# Patient Record
Sex: Female | Born: 1970 | Race: White | Hispanic: No | Marital: Married | Smoking: Never smoker
Health system: Southern US, Community
[De-identification: ages and names within clinical notes are randomized; demographics above are authoritative.]

## PROBLEM LIST (undated history)

## (undated) DIAGNOSIS — F419 Anxiety disorder, unspecified: Secondary | ICD-10-CM

## (undated) HISTORY — PX: TONSILLECTOMY: SUR1361

## (undated) HISTORY — PX: BUNIONECTOMY: SHX129

## (undated) HISTORY — PX: TUBAL LIGATION: SHX77

## (undated) HISTORY — PX: EYE SURGERY: SHX253

---

## 2014-06-14 ENCOUNTER — Emergency Department (HOSPITAL_BASED_OUTPATIENT_CLINIC_OR_DEPARTMENT_OTHER): Payer: BLUE CROSS/BLUE SHIELD

## 2014-06-14 ENCOUNTER — Observation Stay (HOSPITAL_BASED_OUTPATIENT_CLINIC_OR_DEPARTMENT_OTHER)
Admission: EM | Admit: 2014-06-14 | Discharge: 2014-06-17 | Disposition: A | Discharge status: Home / Self Care | Payer: BLUE CROSS/BLUE SHIELD | Attending: Internal Medicine | Admitting: Internal Medicine

## 2014-06-14 ENCOUNTER — Encounter (HOSPITAL_BASED_OUTPATIENT_CLINIC_OR_DEPARTMENT_OTHER): Payer: Self-pay | Admitting: *Deleted

## 2014-06-14 DIAGNOSIS — K8020 Calculus of gallbladder without cholecystitis without obstruction: Secondary | ICD-10-CM | POA: Diagnosis present

## 2014-06-14 DIAGNOSIS — E876 Hypokalemia: Secondary | ICD-10-CM | POA: Diagnosis not present

## 2014-06-14 DIAGNOSIS — R945 Abnormal results of liver function studies: Secondary | ICD-10-CM

## 2014-06-14 DIAGNOSIS — K8050 Calculus of bile duct without cholangitis or cholecystitis without obstruction: Secondary | ICD-10-CM | POA: Diagnosis present

## 2014-06-14 DIAGNOSIS — R7989 Other specified abnormal findings of blood chemistry: Secondary | ICD-10-CM | POA: Diagnosis present

## 2014-06-14 DIAGNOSIS — K8010 Calculus of gallbladder with chronic cholecystitis without obstruction: Principal | ICD-10-CM | POA: Diagnosis present

## 2014-06-14 DIAGNOSIS — F419 Anxiety disorder, unspecified: Secondary | ICD-10-CM | POA: Diagnosis not present

## 2014-06-14 DIAGNOSIS — Z419 Encounter for procedure for purposes other than remedying health state, unspecified: Secondary | ICD-10-CM

## 2014-06-14 DIAGNOSIS — F39 Unspecified mood [affective] disorder: Secondary | ICD-10-CM | POA: Diagnosis not present

## 2014-06-14 HISTORY — DX: Anxiety disorder, unspecified: F41.9

## 2014-06-14 LAB — COMPREHENSIVE METABOLIC PANEL
ALBUMIN: 3.9 g/dL (ref 3.5–5.2)
ALT: 447 U/L — ABNORMAL HIGH (ref 0–35)
ANION GAP: 10 (ref 5–15)
AST: 265 U/L — ABNORMAL HIGH (ref 0–37)
Alkaline Phosphatase: 67 U/L (ref 39–117)
BUN: 16 mg/dL (ref 6–23)
CHLORIDE: 102 mmol/L (ref 96–112)
CO2: 26 mmol/L (ref 19–32)
CREATININE: 0.99 mg/dL (ref 0.50–1.10)
Calcium: 9.2 mg/dL (ref 8.4–10.5)
GFR, EST AFRICAN AMERICAN: 79 mL/min — AB (ref >90)
GFR, EST NON AFRICAN AMERICAN: 68 mL/min — AB (ref >90)
GLUCOSE: 98 mg/dL (ref 70–99)
Potassium: 3.3 mmol/L — ABNORMAL LOW (ref 3.5–5.1)
Sodium: 138 mmol/L (ref 135–145)
Total Bilirubin: 2.4 mg/dL — ABNORMAL HIGH (ref 0.3–1.2)
Total Protein: 6.9 g/dL (ref 6.0–8.3)

## 2014-06-14 LAB — CBC WITH DIFFERENTIAL/PLATELET
BASOS PCT: 0 % (ref 0–1)
Basophils Absolute: 0 10*3/uL (ref 0.0–0.1)
Eosinophils Absolute: 0.1 10*3/uL (ref 0.0–0.7)
Eosinophils Relative: 1 % (ref 0–5)
HEMATOCRIT: 40.7 % (ref 36.0–46.0)
Hemoglobin: 13.7 g/dL (ref 12.0–15.0)
LYMPHS PCT: 21 % (ref 12–46)
Lymphs Abs: 1.7 10*3/uL (ref 0.7–4.0)
MCH: 30.6 pg (ref 26.0–34.0)
MCHC: 33.7 g/dL (ref 30.0–36.0)
MCV: 90.8 fL (ref 78.0–100.0)
Monocytes Absolute: 0.5 10*3/uL (ref 0.1–1.0)
Monocytes Relative: 7 % (ref 3–12)
NEUTROS ABS: 5.6 10*3/uL (ref 1.7–7.7)
Neutrophils Relative %: 71 % (ref 43–77)
PLATELETS: 238 10*3/uL (ref 150–400)
RBC: 4.48 MIL/uL (ref 3.87–5.11)
RDW: 12.8 % (ref 11.5–15.5)
WBC: 7.9 10*3/uL (ref 4.0–10.5)

## 2014-06-14 LAB — LIPASE, BLOOD: LIPASE: 35 U/L (ref 11–59)

## 2014-06-14 MED ORDER — OXYCODONE HCL 5 MG PO TABS: 5.0000 mg | ORAL_TABLET | ORAL | Status: DC | PRN

## 2014-06-14 MED ORDER — MORPHINE SULFATE 4 MG/ML IJ SOLN: 4.0000 mg | Freq: Once | INTRAMUSCULAR | Status: DC

## 2014-06-14 MED ORDER — HEPARIN SODIUM (PORCINE) 5000 UNIT/ML IJ SOLN
5000.0000 [IU] | Freq: Three times a day (TID) | INTRAMUSCULAR | Status: DC
  Administered 2014-06-14 – 2014-06-15 (×3): 5000 [IU] via SUBCUTANEOUS
  Filled 2014-06-14 (×8): qty 1

## 2014-06-14 MED ORDER — MORPHINE SULFATE 2 MG/ML IJ SOLN
2.0000 mg | INTRAMUSCULAR | Status: DC | PRN
  Administered 2014-06-15 (×3): 2 mg via INTRAVENOUS
  Filled 2014-06-14 (×3): qty 1

## 2014-06-14 MED ORDER — HYDROMORPHONE HCL 1 MG/ML IJ SOLN: 1.0000 mg | INTRAMUSCULAR | Status: DC | PRN

## 2014-06-14 MED ORDER — ONDANSETRON HCL 4 MG/2ML IJ SOLN: 4.0000 mg | Freq: Three times a day (TID) | INTRAMUSCULAR | Status: DC | PRN

## 2014-06-14 MED ORDER — ONDANSETRON HCL 4 MG PO TABS: 4.0000 mg | ORAL_TABLET | Freq: Four times a day (QID) | ORAL | Status: DC | PRN

## 2014-06-14 MED ORDER — ALPRAZOLAM 0.5 MG PO TABS
0.5000 mg | ORAL_TABLET | Freq: Every evening | ORAL | Status: DC | PRN
  Administered 2014-06-15 – 2014-06-17 (×3): 0.5 mg via ORAL
  Filled 2014-06-14 (×3): qty 1

## 2014-06-14 MED ORDER — CITALOPRAM HYDROBROMIDE 10 MG PO TABS
10.0000 mg | ORAL_TABLET | Freq: Every day | ORAL | Status: DC
  Administered 2014-06-15 – 2014-06-17 (×2): 10 mg via ORAL
  Filled 2014-06-14 (×3): qty 1

## 2014-06-14 MED ORDER — ONDANSETRON HCL 4 MG/2ML IJ SOLN: 4.0000 mg | Freq: Four times a day (QID) | INTRAMUSCULAR | Status: DC | PRN

## 2014-06-14 MED ORDER — MORPHINE SULFATE 2 MG/ML IJ SOLN
2.0000 mg | Freq: Once | INTRAMUSCULAR | Status: AC
  Administered 2014-06-14: 2 mg via INTRAVENOUS
  Filled 2014-06-14: qty 1

## 2014-06-14 MED ORDER — SODIUM CHLORIDE 0.9 % IV SOLN
INTRAVENOUS | Status: DC
  Administered 2014-06-14 – 2014-06-16 (×4): via INTRAVENOUS

## 2014-06-14 NOTE — ED Notes (Signed)
Earrings and all Jewelry given to family.

## 2014-06-14 NOTE — ED Notes (Signed)
Abdominal pain. She has been having episodes of epigastric pain with no regularity or pattern over the past 4 weeks. Unrelieved by antiacids or Ibuprofen.

## 2014-06-14 NOTE — ED Notes (Signed)
Stopped doxycyline 2 weeks ago for rash.

## 2014-06-14 NOTE — H&P (Addendum)
[facility] History and Physical  Patient: Darlene Stevens  MRN: [id number]  DOB: March 29, 1970  DOS: the patient was seen and examined on 06/14/2014 PCP: Almedia Balls, MD  Chief Complaint: Abdominal pain  HPI: Darlene Stevens is a 44 y.o. female with Past medical history of anxiety. The patient is presenting with complaints of abdominal pain that has been ongoing since last few days. The pain is located in the epigastric and has no association with food. The pain is sharp and crampy and resolved within half an hour on its own. She does not have any vomiting but has felt nausea with the pain. She has occasional acid reflux. Denies any diarrhea or constipation or change in the color of the stool. Denies any burning urination. Denies any fever or chills. Denies any rash anywhere. She has history of cesarean section as well as tubal ligation. She does not abuse alcohol. She does not use regular Tylenol. Denies any use of herbal supplements.  She does not have any family history of major liver disorder, heart disorders.  The patient is coming from home And at her baseline independent for most of her ADL.  Review of Systems: as mentioned in the history of present illness.  A comprehensive review of the other systems is negative.  Past Medical History  Diagnosis Date  . Anxiety    Past Surgical History  Procedure Laterality Date  . Cesarean section    . Tonsillectomy    . Eye surgery    . Tubal ligation    . Bunionectomy     Social History:  reports that she has never smoked. She does not have any smokeless tobacco history on file. She reports that she does not drink alcohol or use illicit drugs.  No Known Allergies  History reviewed. No pertinent family history.  Prior to Admission medications   Medication Sig Start Date End Date Taking? Authorizing Provider  ALPRAZolam Prudy Feeler) 0.5 MG tablet Take 0.5 mg by mouth at bedtime as needed for anxiety or sleep.   Yes Historical Provider, MD    citalopram (CELEXA) 10 MG tablet Take 10 mg by mouth every morning.   Yes Historical Provider, MD    Physical Exam: Filed Vitals:   06/14/14 1416 06/14/14 1730 06/14/14 1822 06/14/14 1917  BP: 142/84 121/70 117/66 118/60  Pulse: 60 58 56 50  Temp: 98.3 F (36.8 C)  98 F (36.7 C) 97.9 F (36.6 C)  TempSrc: Oral  Oral Oral  Resp: Height:  (1.727 m)    (1.727 m)  Weight: 62.143 kg (137 lb)   62.823 kg (138 lb 8 oz)  SpO2: 100% 100% 100% 98%    General: Alert, Awake and Oriented to Time, Place and Person. Appear in mild distress Eyes: PERRL ENT: Oral Mucosa clear moist. Neck: no JVD Cardiovascular: S1 and S2 Present, no Murmur, Peripheral Pulses Present Respiratory: Bilateral Air entry equal and Decreased,  Clear to Auscultation, njoCrackles, no wheezes Abdomen: Bowel Sound present, Soft and mildly epigastric tender Skin: no Rash Extremities: no Pedal edema, no calf tenderness Neurologic: Grossly no focal neuro deficit.  Labs on Admission:  CBC:  Recent Labs Lab 06/14/14 1440  WBC 7.9  NEUTROABS 5.6  HGB 13.7  HCT 40.7  MCV 90.8  PLT 238    CMP     Component Value Date/Time   NA 138 06/14/2014 1440   K 3.3* 06/14/2014 1440   CL 102 06/14/2014 1440   CO2 26 06/14/2014  1440   GLUCOSE 98 06/14/2014 1440   BUN 16 06/14/2014 1440   CREATININE 0.99 06/14/2014 1440   CALCIUM 9.2 06/14/2014 1440   PROT 6.9 06/14/2014 1440   ALBUMIN 3.9 06/14/2014 1440   AST 265* 06/14/2014 1440   ALT 447* 06/14/2014 1440   ALKPHOS 67 06/14/2014 1440   BILITOT 2.4* 06/14/2014 1440   GFRNONAA 68* 06/14/2014 1440   GFRAA 79* 06/14/2014 1440     Recent Labs Lab 06/14/14 1440  LIPASE 35    No results for input(s): CKTOTAL, CKMB, CKMBINDEX, TROPONINI in the last 168 hours. BNP (last 3 results) No results for input(s): BNP in the last 8760 hours.  ProBNP (last 3 results) No results for input(s): PROBNP in the last 8760 hours.   Radiological Exams  on Admission: [place] Abdomen Complete  06/14/2014   CLINICAL DATA:  Epigastric pain, nausea for 3 weeks. Worsening today.  EXAM: ULTRASOUND ABDOMEN COMPLETE  COMPARISON:  None.  FINDINGS: Gallbladder: Gallbladder appears distended. Multiple small mobile echogenic foci compatible with gallstones, the largest 1 cm. No wall thickening. Negative sonographic Murphy's.  Common bile duct: Diameter: Upper limits normal in caliber at 7 mm.  Liver: Suggestion of intrahepatic biliary ductal dilatation. No focal abnormality. Normal echotexture.  IVC: No abnormality visualized.  Pancreas: Visualized portion unremarkable.  Spleen: Size and appearance within normal limits.  Right Kidney: Length: 11.3 cm. Echogenicity within normal limits. No mass or hydronephrosis visualized.  Left Kidney: Length: 11.5 cm. Echogenicity within normal limits. No mass or hydronephrosis visualized.  Abdominal aorta: No aneurysm visualized.  Other findings: None.  IMPRESSION: Mild gallbladder distention with several small mobile gallstones. No wall thickening or sonographic Murphy's sign.  Borderline common bile duct diameter at 7 mm. Suggestion of mild intrahepatic biliary ductal dilatation. Recommend correlation with LFTs.   Electronically Signed   By: Charlett NoseKevin  Dover M.D.   On: 06/14/2014 16:43    Assessment/Plan Principal Problem:   Biliary colic Active Problems:   Gallstones   LFT elevation   Hypokalemia   Mood disorder   1. Biliary colic The patient is presenting with complaints of abdominal pain. She was found to have elevated LFT. The pain was mild. An ultrasound was showing presence of gallstones. Case was discussed with general surgery Dr. Ricky StabsJerkins who will be seeing the patient in the morning. Case was also discussed with Dr. Marina GoodellPerry who will be also seeing the patient in the morning. Recommended to give the patient nothing by mouth except medication due to ongoing pain. Lipase levels are negative suggesting no significant and  hepatitis. We will recheck lipase level again in the morning.  2. Hypokalemia. Replacing intravenously.  3. Mood disorder, anxiety. Continuing home medications.  4. Elevated LFT most likely secondary to gallstones. Avoiding hepatotoxic medications. Further workup depending on the recommendation.   Consults: Gen. surgery Dr. Ricky StabsJerkins, Dr. Marina GoodellPerry from gastroenterology  DVT Prophylaxis: subcutaneous Heparin Nutrition: Nothing by mouth except medication due to ongoing pain  Disposition: Admitted as inpatient, med-surge unit.  Author: Lynden OxfordPranav Adellyn Capek, MD [facility] Pager: [phone] 06/14/2014  If 7PM-7AM, please contact night-coverage [email or web address] Password [email or web address]

## 2014-06-14 NOTE — ED Provider Notes (Signed)
CSN: [id number]     Arrival date & time 06/14/14  1409 History   First MD Initiated Contact with Patient 06/14/14 1421     Chief Complaint  Patient presents with  . Abdominal Pain     (Consider location/radiation/quality/duration/timing/severity/associated sxs/prior Treatment) HPI Comments: 44 year old female complaining of intermittent epigastric abdominal pain 4 weeks. Pain localized to the epigastric area, nonradiating, described as dull. No aggravating or alleviating factors. No relation to food. Pain unrelieved by antacids or ibuprofen. Denies fever, chills, nausea, vomiting, urinary symptoms, chest pain or shortness of breath. Pain typically resolves in about 30 minutes, however today, the pain lasted a few hours, and is starting to subside on its own. No appetite change.  Patient is a 44 y.o. female presenting with abdominal pain. The history is provided by the patient.  Abdominal Pain   Past Medical History  Diagnosis Date  . Anxiety    Past Surgical History  Procedure Laterality Date  . Cesarean section    . Tonsillectomy    . Eye surgery    . Tubal ligation     No family history on file. History  Substance Use Topics  . Smoking status: Never Smoker   . Smokeless tobacco: Not on file  . Alcohol Use: No   OB History    No data available     Review of Systems  Gastrointestinal: Positive for abdominal pain.  All other systems reviewed and are negative.     Allergies  Review of patient's allergies indicates no known allergies.  Home Medications   Prior to Admission medications   Medication Sig Start Date End Date Taking? Authorizing Provider  citalopram (CELEXA) 10 MG tablet Take 10 mg by mouth daily.   Yes Historical Provider, MD   BP 121/70 mmHg  Pulse 58  Temp(Src) 98.3 F (36.8 C) (Oral)  Resp 17  Ht  (1.727 m)  Wt 137 lb (62.143 kg)  BMI 20.84 kg/m2  SpO2 100% Physical Exam  Constitutional: She is oriented to person, place, and time. She  appears well-developed and well-nourished. No distress.  HENT:  Head: Normocephalic and atraumatic.  Mouth/Throat: Oropharynx is clear and moist.  Eyes: Conjunctivae and EOM are normal.  Neck: Normal range of motion. Neck supple.  Cardiovascular: Normal rate, regular rhythm and normal heart sounds.   Pulmonary/Chest: Effort normal and breath sounds normal. No respiratory distress.  Abdominal: Soft. Normal appearance and bowel sounds are normal. She exhibits no distension. There is tenderness in the right upper quadrant and epigastric area. There is positive Murphy's sign. There is no rigidity, no rebound and no guarding.  Musculoskeletal: Normal range of motion. She exhibits no edema.  Neurological: She is alert and oriented to person, place, and time. No sensory deficit.  Skin: Skin is warm and dry.  Psychiatric: She has a normal mood and affect. Her behavior is normal.  Nursing note and vitals reviewed.   ED Course  Procedures (including critical care time) Labs Review Labs Reviewed  COMPREHENSIVE METABOLIC PANEL - Abnormal; Notable for the following:    Potassium 3.3 (*)    AST 265 (*)    ALT 447 (*)    Total Bilirubin 2.4 (*)    GFR calc non Af Amer 68 (*)    GFR calc Af Amer 79 (*)    All other components within normal limits  CBC WITH DIFFERENTIAL/PLATELET  LIPASE, BLOOD    Imaging Review US Abdomen Complete  06/14/2014   CLINICAL DATA:  Epigastric pain,  nausea for 3 weeks. Worsening today.  EXAM: ULTRASOUND ABDOMEN COMPLETE  COMPARISON:  None.  FINDINGS: Gallbladder: Gallbladder appears distended. Multiple small mobile echogenic foci compatible with gallstones, the largest 1 cm. No wall thickening. Negative sonographic Murphy's.  Common bile duct: Diameter: Upper limits normal in caliber at 7 mm.  Liver: Suggestion of intrahepatic biliary ductal dilatation. No focal abnormality. Normal echotexture.  IVC: No abnormality visualized.  Pancreas: Visualized portion unremarkable.   Spleen: Size and appearance within normal limits.  Right Kidney: Length: 11.3 cm. Echogenicity within normal limits. No mass or hydronephrosis visualized.  Left Kidney: Length: 11.5 cm. Echogenicity within normal limits. No mass or hydronephrosis visualized.  Abdominal aorta: No aneurysm visualized.  Other findings: None.  IMPRESSION: Mild gallbladder distention with several small mobile gallstones. No wall thickening or sonographic Murphy's sign.  Borderline common bile duct diameter at 7 mm. Suggestion of mild intrahepatic biliary ductal dilatation. Recommend correlation with LFTs.   Electronically Signed   By: Charlett NoseKevin  Dover M.D.   On: 06/14/2014 16:43     EKG Interpretation   Date/Time:  Tuesday June 14 2014 14:25:41 EDT Ventricular Rate:  54 PR Interval:  138 QRS Duration: 82 QT Interval:  440 QTC Calculation: 417 R Axis:   81 Text Interpretation:  Sinus bradycardia with Premature atrial complexes  Otherwise normal ECG No old tracing to compare Confirmed by GOLDSTON  MD,  SCOTT ([id number]) on 06/14/2014 2:30:18 PM      MDM   Final diagnoses:  Gallstones  Biliary colic   NAD. Nontoxic appearing. AF of VSS. Abdomen soft with epigastric and right upper quadrant tenderness and positive Murphy's sign. Initial concern for gallbladder pathology. Labs obtained showing elevated liver enzymes and bili. Abdominal ultrasound with results as shown above. I spoke with Dr. Gerrit FriendsGerkin with general surgery who suggests admitting the patient to the hospitalist, having a GI consult the patient and he will take her to the OR for surgery for gallbladder tomorrow. I spoke with Dr. Elisabeth Pigeonevine who accepts the patient for admission to medical bed. I spoke with Dr. Marina GoodellPerry with GI who will evaluate pt tomorrow. Pt comfortable and stable for transfer.  Kathrynn SpeedRobyn M Keyerra Lamere, PA-C 06/14/14 1836  Pricilla LovelessScott Goldston, MD 06/16/14 [id number]

## 2014-06-15 DIAGNOSIS — K8020 Calculus of gallbladder without cholecystitis without obstruction: Secondary | ICD-10-CM | POA: Diagnosis not present

## 2014-06-15 DIAGNOSIS — K8010 Calculus of gallbladder with chronic cholecystitis without obstruction: Secondary | ICD-10-CM | POA: Diagnosis not present

## 2014-06-15 DIAGNOSIS — R7989 Other specified abnormal findings of blood chemistry: Secondary | ICD-10-CM | POA: Diagnosis not present

## 2014-06-15 DIAGNOSIS — K8050 Calculus of bile duct without cholangitis or cholecystitis without obstruction: Secondary | ICD-10-CM | POA: Diagnosis not present

## 2014-06-15 DIAGNOSIS — E876 Hypokalemia: Secondary | ICD-10-CM | POA: Diagnosis not present

## 2014-06-15 LAB — COMPREHENSIVE METABOLIC PANEL
ALBUMIN: 3.4 g/dL — AB (ref 3.5–5.2)
ALT: 462 U/L — ABNORMAL HIGH (ref 0–35)
ANION GAP: 5 (ref 5–15)
AST: 232 U/L — AB (ref 0–37)
Alkaline Phosphatase: 93 U/L (ref 39–117)
BILIRUBIN TOTAL: 1.3 mg/dL — AB (ref 0.3–1.2)
BUN: 17 mg/dL (ref 6–23)
CO2: 25 mmol/L (ref 19–32)
CREATININE: 0.93 mg/dL (ref 0.50–1.10)
Calcium: 8.2 mg/dL — ABNORMAL LOW (ref 8.4–10.5)
Chloride: 109 mmol/L (ref 96–112)
GFR calc Af Amer: 85 mL/min — ABNORMAL LOW (ref >90)
GFR calc non Af Amer: 73 mL/min — ABNORMAL LOW (ref >90)
Glucose, Bld: 102 mg/dL — ABNORMAL HIGH (ref 70–99)
POTASSIUM: 3.9 mmol/L (ref 3.5–5.1)
SODIUM: 139 mmol/L (ref 135–145)
TOTAL PROTEIN: 5.8 g/dL — AB (ref 6.0–8.3)

## 2014-06-15 LAB — CBC
HCT: 38.6 % (ref 36.0–46.0)
Hemoglobin: 12.5 g/dL (ref 12.0–15.0)
MCH: 30.2 pg (ref 26.0–34.0)
MCHC: 32.4 g/dL (ref 30.0–36.0)
MCV: 93.2 fL (ref 78.0–100.0)
Platelets: 231 10*3/uL (ref 150–400)
RBC: 4.14 MIL/uL (ref 3.87–5.11)
RDW: 13.4 % (ref 11.5–15.5)
WBC: 5.3 10*3/uL (ref 4.0–10.5)

## 2014-06-15 LAB — LIPASE, BLOOD: Lipase: 40 U/L (ref 11–59)

## 2014-06-15 MED ORDER — POTASSIUM CHLORIDE 10 MEQ/100ML IV SOLN
10.0000 meq | INTRAVENOUS | Status: AC
  Administered 2014-06-15 (×2): 10 meq via INTRAVENOUS
  Filled 2014-06-15 (×2): qty 100

## 2014-06-15 NOTE — Consult Note (Signed)
Consultation  Referring Provider: [facility] Primary Care Physician:  Almedia BallsKELLY,SAM, MD Primary Gastroenterologist:  none.  Reason for Consultation:    Acute abdominal pain, gallstones -r/o CBD stone  HPI: Darlene Stevens is a 44 y.o. female who was admitted through the emergency room last night with acute abdominal pain. Patient is generally in good health states she has lost about 65 pounds over the past year. His was intentional. She started having episodes of epigastric pain about a month ago and has had several episodes since that time. She says these were usually lasting 30-40 minutes associated with some nausea and with then resolved. She had an episode yesterday that lasted for several hours was more intense than any of the other episodes and would not go away so she came to the emergency room. She is currently feeling better says she has a dull ache in her epigastric area. Workup through the emergency room with upper abdominal ultrasound showed a distended gallbladder multiple gallstones the largest 1 cm no wall thickening and CBD of 7 mm. Labs on admission with total bili of 1.3 and elevated transaminases today total bili 2.4 AST of 265 ALT of 44. Lipase was within normal limits. Patient has been seen by surgery and plans for cholecystectomy are in progress.   Past Medical History  Diagnosis Date  . Anxiety     Past Surgical History  Procedure Laterality Date  . Cesarean section    . Tonsillectomy    . Eye surgery    . Tubal ligation    . Bunionectomy      Prior to Admission medications   Medication Sig Start Date End Date Taking? Authorizing Provider  ALPRAZolam Prudy Feeler(XANAX) 0.5 MG tablet Take 0.5 mg by mouth at bedtime as needed for anxiety or sleep.   Yes Historical Provider, MD  citalopram (CELEXA) 10 MG tablet Take 10 mg by mouth every morning.   Yes Historical Provider, MD    Current Facility-Administered Medications  Medication Dose Route Frequency Provider Last  Rate Last Dose  . 0.9 %  sodium chloride infusion   Intravenous Continuous Rolly SalterPranav M Patel, MD 100 mL/hr at 06/15/14 0732    . ALPRAZolam Prudy Feeler(XANAX) tablet 0.5 mg  0.5 mg Oral QHS PRN Rolly SalterPranav M Patel, MD   0.5 mg at 06/15/14 0024  . citalopram (CELEXA) tablet 10 mg  10 mg Oral Daily Rolly SalterPranav M Patel, MD      . heparin injection 5,000 Units  5,000 Units Subcutaneous 3 times per day Rolly SalterPranav M Patel, MD   5,000 Units at 06/15/14 0511  . morphine 2 MG/ML injection 2 mg  2 mg Intravenous Q3H PRN Rolly SalterPranav M Patel, MD   2 mg at 06/15/14 0024  . ondansetron (ZOFRAN) tablet 4 mg  4 mg Oral Q6H PRN Rolly SalterPranav M Patel, MD       Or  . ondansetron [facility](ZOFRAN) injection 4 mg  4 mg Intravenous Q6H PRN Rolly SalterPranav M Patel, MD      . oxyCODONE (Oxy IR/ROXICODONE) immediate release tablet 5 mg  5 mg Oral Q4H PRN Rolly SalterPranav M Patel, MD        Allergies as of 06/14/2014  . (No Known Allergies)    History reviewed. No pertinent family history.  History   Social History  . Marital Status: Married    Spouse Name: N/A  . Number of Children: N/A  . Years of Education: N/A   Occupational History  . Not on file.   Social History Main  Topics  . Smoking status: Never Smoker   . Smokeless tobacco: Not on file  . Alcohol Use: No  . Drug Use: No  . Sexual Activity: Not on file   Other Topics Concern  . Not on file   Social History Narrative  . No narrative on file    Review of Systems: Pertinent positive and negative review of systems were noted in the above HPI section.  All other review of systems was otherwise negative.n.  Physical Exam: Vital signs in last 24 hours: Temp:  [97.9 F (36.6 C)-98.3 F (36.8 C)] 98.2 F (36.8 C) (04/13 0554) Pulse Rate:  [50-64] 64 (04/13 0554) Resp:  [16-20] 16 (04/13 0554) BP: (90-142)/(52-84) 90/52 mmHg (04/13 0554) SpO2:  [98 %-100 %] 98 % (04/13 0554) Weight:  [137 lb (62.143 kg)-138 lb 8 oz (62.823 kg)] 138 lb 8 oz (62.823 kg) (04/13 0554) Last BM Date: 06/14/14 General:    Alert,  Well-developed, well-nourished, WF,pleasant and cooperative in NAD Head:  Normocephalic and atraumatic. Eyes:  Sclera clear, no icterus.   Conjunctiva pink. Ears:  Normal auditory acuity. Nose:  No deformity, discharge,  or lesions. Mouth:  No deformity or lesions.   Neck:  Supple; no masses or thyromegaly. Lungs:  Clear throughout to auscultation.   No wheezes, crackles, or rhonchi. Heart:  Regular rate and rhythm; no murmurs, clicks, rubs,  or gallops. Abdomen:  Soft,mildly tender epigastrium  tender, BS active,nonpalp mass or hsm.   Rectal:  Deferred  Msk:  Symmetrical without gross deformities. . Pulses:  Normal pulses noted. Extremities:  Without clubbing or edema. Neurologic:  Alert and  oriented x4;  grossly normal neurologically. Skin:  Intact without significant lesions or rashes.. Psych:  Alert and cooperative. Normal mood and affect.  Intake/Output from previous day: 04/12 0701 - 04/13 0700 In: 973.3 [I.V.:973.3] Out: -  Intake/Output this shift:    Lab Results:  Recent Labs  06/14/14 1440 06/15/14 0535  WBC 7.9 5.3  HGB 13.7 12.5  HCT 40.7 38.6  PLT 238 231   BMET  Recent Labs  06/14/14 1440 06/15/14 0535  NA 138 139  K 3.3* 3.9  CL 102 109  CO2 26 25  GLUCOSE 98 102*  BUN 16 17  CREATININE 0.99 0.93  CALCIUM 9.2 8.2*   LFT  Recent Labs  06/15/14 0535  PROT 5.8*  ALBUMIN 3.4*  AST 232*  ALT 462*  ALKPHOS 93  BILITOT 1.3*   PT/INR No results for input(s): LABPROT, INR in the last 72 hours. Hepatitis Panel No results for input(s): HEPBSAG, HCVAB, HEPAIGM, HEPBIGM in the last 72 hours.   IMP; #44  44 year old female with several episodes of epigastric pain over the past month consistent with biliary colic and ultrasound showing distended gallbladder multiple gallstones and a 7 mm CBD. She does have elevated LFTs and may have a common bile duct stone or stones. She does not have any evidence of cholecystitis or cholangitis at  present #2 recent weight loss ,intentional  PLAN: Discussed with surgery, would proceed with cholecystectomy and IOC. If stones found we will plan for ERCP Friday due to scheduling. If patient cannot have cholecystectomy today she is very hungry and would allow by mouth's. ERCP and sphincterotomy with stone extraction were discussed in detail with the patient including 5-10% risk of pancreatitis, risk of bleeding, perforation, infection, cardiopulmonary events. She voices understanding and is agreeable to proceed if indicated. Follow-up labs in a.m.   Amy Esterwood  06/15/2014, 10:27  AM      Attending physician's note   I have taken a history, examined the patient and reviewed the chart. I agree with the Advanced Practitioner's note, impression and recommendations. Episodes of epigastric pain with cholelithiasis and elevated LFTs. Borderline CBD dilation at 7 mm. Possible choledocholithiasis. Agree with cholecystectomy and IOC. ERCP on Friday if IOC is positive.    Meryl Dare, MD [facility]

## 2014-06-15 NOTE — Progress Notes (Signed)
CARE MANAGEMENT NOTE 06/15/2014  Patient:  Darlene Stevens,Darlene Stevens   Account Number:  [id number]  Date Initiated:  06/15/2014  Documentation initiated by:  DAVIS,RHONDA  Subjective/Objective Assessment:   abd pain unknown etiology poss. obstructive gb diease/ams versus anexity disorder by patient.     Action/Plan:   home when stable   Anticipated DC Date:  06/18/2014   Anticipated DC Plan:  HOME/SELF CARE  In-house referral  NA      DC Planning Services  CM consult      PAC Choice  NA   Choice offered to / List presented to:  NA           Status of service:  In process, will continue to follow Medicare Important Message given?   (If response is "NO", the following Medicare IM given date fields will be blank) Date Medicare IM given:   Medicare IM given by:   Date Additional Medicare IM given:   Additional Medicare IM given by:    Discharge Disposition:    Per UR Regulation:  Reviewed for med. necessity/level of care/duration of stay  If discussed at Long Length of Stay Meetings, dates discussed:    Comments:  June 15, 2014/Rhonda L. Earlene Plateravis, RN, BSN, CCM. Case Management [employer] [phone] No discharge needs present of time of review.

## 2014-06-15 NOTE — Consult Note (Signed)
Reason for Consult: Abdominal pain on and off for 4 weeks Referring Physician:  Riley Kill is an 44 y.o. female.   HPI: 44 y/o seen with intermittent pain on and off for 3 weeks.  She doesn't know what brings in on.  Last about 30-40 minutes.  Not relieved by ibuprofen or Tums. 1st two spells occurred in rapid succession, then she did ok for a time.  2nd time a single episode, the 3rd time 2 episodes in a row.  Yesterday it started about 10:30 AM and lasted till 4 PM.  Sx would wax and wane.   Pain mid epigastric region, some nausea, no vomiting, no fever, severity would wax and wane.  Work up at [facility]: Afebrile, VSS, T bil 2.4 AST 265,ALT 447, lipase 35 last PM.  WBC 7.9.  Ultrasound shows:  Gallbladder appears distended. Multiple small mobile echogenic foci compatible with gallstones, the largest 1 cm. No wall thickening. Negative sonographic Murphy's.  Common bile duct: Diameter: Upper limits normal in caliber at 7 mm. Liver: Suggestion of intrahepatic biliary ductal dilatation. No focal abnormality. Normal echotexture.  She was admitted by Medicine last evening and we are ask to see.  Past Medical History  Diagnosis Date  . Anxiety     Past Surgical History  Procedure Laterality Date  . Cesarean section    . Tonsillectomy    . Eye surgery    . Tubal ligation    . Bunionectomy      History reviewed. No pertinent family history.  Social History:  reports that she has never smoked. She does not have any smokeless tobacco history on file. She reports that she does not drink alcohol or use illicit drugs.  Allergies: No Known Allergies  Prior to Admission medications   Medication Sig Start Date End Date Taking? Authorizing Provider  ALPRAZolam Duanne Moron) 0.5 MG tablet Take 0.5 mg by mouth at bedtime as needed for anxiety or sleep.   Yes Historical Provider, MD  citalopram (CELEXA) 10 MG tablet Take 10 mg by mouth every morning.   Yes Historical Provider, MD      Results for orders placed or performed during the hospital encounter of 06/14/14 (from the past 48 hour(s))  Comprehensive metabolic panel     Status: Abnormal   Collection Time: 06/14/14  2:40 PM  Result Value Ref Range   Sodium 138 135 - 145 mmol/L   Potassium 3.3 (L) 3.5 - 5.1 mmol/L   Chloride 102 96 - 112 mmol/L   CO2 26 19 - 32 mmol/L   Glucose, Bld 98 70 - 99 mg/dL   BUN 16 6 - 23 mg/dL   Creatinine, Ser 0.99 0.50 - 1.10 mg/dL   Calcium 9.2 8.4 - 10.5 mg/dL   Total Protein 6.9 6.0 - 8.3 g/dL   Albumin 3.9 3.5 - 5.2 g/dL   AST 265 (H) 0 - 37 U/L   ALT 447 (H) 0 - 35 U/L   Alkaline Phosphatase 67 39 - 117 U/L   Total Bilirubin 2.4 (H) 0.3 - 1.2 mg/dL   GFR calc non Af Amer 68 (L) >90 mL/min   GFR calc Af Amer 79 (L) >90 mL/min    Comment: (NOTE) The eGFR has been calculated using the CKD EPI equation. This calculation has not been validated in all clinical situations. eGFR's persistently <90 mL/min signify possible Chronic Kidney Disease.    Anion gap 10 5 - 15  CBC with Differential  Status: None   Collection Time: 06/14/14  2:40 PM  Result Value Ref Range   WBC 7.9 4.0 - 10.5 K/uL   RBC 4.48 3.87 - 5.11 MIL/uL   Hemoglobin 13.7 12.0 - 15.0 g/dL   HCT 40.7 36.0 - 46.0 %   MCV 90.8 78.0 - 100.0 fL   MCH 30.6 26.0 - 34.0 pg   MCHC 33.7 30.0 - 36.0 g/dL   RDW 12.8 11.5 - 15.5 %   Platelets 238 150 - 400 K/uL   Neutrophils Relative % 71 43 - 77 %   Neutro Abs 5.6 1.7 - 7.7 K/uL   Lymphocytes Relative 21 12 - 46 %   Lymphs Abs 1.7 0.7 - 4.0 K/uL   Monocytes Relative 7 3 - 12 %   Monocytes Absolute 0.5 0.1 - 1.0 K/uL   Eosinophils Relative 1 0 - 5 %   Eosinophils Absolute 0.1 0.0 - 0.7 K/uL   Basophils Relative 0 0 - 1 %   Basophils Absolute 0.0 0.0 - 0.1 K/uL  Lipase, blood     Status: None   Collection Time: 06/14/14  2:40 PM  Result Value Ref Range   Lipase 35 11 - 59 U/L  Comprehensive metabolic panel     Status: Abnormal   Collection Time:  06/15/14  5:35 AM  Result Value Ref Range   Sodium 139 135 - 145 mmol/L   Potassium 3.9 3.5 - 5.1 mmol/L   Chloride 109 96 - 112 mmol/L   CO2 25 19 - 32 mmol/L   Glucose, Bld 102 (H) 70 - 99 mg/dL   BUN 17 6 - 23 mg/dL   Creatinine, Ser 0.93 0.50 - 1.10 mg/dL   Calcium 8.2 (L) 8.4 - 10.5 mg/dL   Total Protein 5.8 (L) 6.0 - 8.3 g/dL   Albumin 3.4 (L) 3.5 - 5.2 g/dL   AST 232 (H) 0 - 37 U/L   ALT 462 (H) 0 - 35 U/L   Alkaline Phosphatase 93 39 - 117 U/L   Total Bilirubin 1.3 (H) 0.3 - 1.2 mg/dL   GFR calc non Af Amer 73 (L) >90 mL/min   GFR calc Af Amer 85 (L) >90 mL/min    Comment: (NOTE) The eGFR has been calculated using the CKD EPI equation. This calculation has not been validated in all clinical situations. eGFR's persistently <90 mL/min signify possible Chronic Kidney Disease.    Anion gap 5 5 - 15  CBC     Status: None   Collection Time: 06/15/14  5:35 AM  Result Value Ref Range   WBC 5.3 4.0 - 10.5 K/uL   RBC 4.14 3.87 - 5.11 MIL/uL   Hemoglobin 12.5 12.0 - 15.0 g/dL   HCT 38.6 36.0 - 46.0 %   MCV 93.2 78.0 - 100.0 fL   MCH 30.2 26.0 - 34.0 pg   MCHC 32.4 30.0 - 36.0 g/dL   RDW 13.4 11.5 - 15.5 %   Platelets 231 150 - 400 K/uL  Lipase, blood     Status: None   Collection Time: 06/15/14  5:40 AM  Result Value Ref Range   Lipase 40 11 - 59 U/L    US Abdomen Complete  06/14/2014   CLINICAL DATA:  Epigastric pain, nausea for 3 weeks. Worsening today.  EXAM: ULTRASOUND ABDOMEN COMPLETE  COMPARISON:  None.  FINDINGS: Gallbladder: Gallbladder appears distended. Multiple small mobile echogenic foci compatible with gallstones, the largest 1 cm. No wall thickening. Negative sonographic Murphy's.  Common bile  duct: Diameter: Upper limits normal in caliber at 7 mm.  Liver: Suggestion of intrahepatic biliary ductal dilatation. No focal abnormality. Normal echotexture.  IVC: No abnormality visualized.  Pancreas: Visualized portion unremarkable.  Spleen: Size and appearance within  normal limits.  Right Kidney: Length: 11.3 cm. Echogenicity within normal limits. No mass or hydronephrosis visualized.  Left Kidney: Length: 11.5 cm. Echogenicity within normal limits. No mass or hydronephrosis visualized.  Abdominal aorta: No aneurysm visualized.  Other findings: None.  IMPRESSION: Mild gallbladder distention with several small mobile gallstones. No wall thickening or sonographic Murphy's sign.  Borderline common bile duct diameter at 7 mm. Suggestion of mild intrahepatic biliary ductal dilatation. Recommend correlation with LFTs.   Electronically Signed   By: Kevin  Dover M.D.   On: 06/14/2014 16:43    Review of Systems  Constitutional: Negative for fever, chills, weight loss, malaise/fatigue and diaphoresis.       She works out 5 days a week.  HENT: Negative.   Eyes: Negative.        Needs reading glasses now.  Respiratory: Negative.   Cardiovascular: Negative.   Gastrointestinal: Positive for nausea and abdominal pain. Negative for heartburn, vomiting, diarrhea, constipation, blood in stool and melena.  Genitourinary: Negative.   Musculoskeletal: Negative.   Skin: Negative.   Neurological: Negative.  Negative for weakness.  Endo/Heme/Allergies: Negative.   Psychiatric/Behavioral: The patient is nervous/anxious (going thru divorce/seperation).    Blood pressure 90/52, pulse 64, temperature 98.2 F (36.8 C), temperature source Oral, resp. rate 16, height 5' 8" (1.727 m), weight 62.823 kg (138 lb 8 oz), SpO2 98 %. Physical Exam  Constitutional: She is oriented to person, place, and time. She appears well-developed and well-nourished. No distress.  HENT:  Head: Normocephalic and atraumatic.  Nose: Nose normal.  Eyes: Conjunctivae and EOM are normal. Right eye exhibits no discharge. Left eye exhibits no discharge. No scleral icterus.  Neck: Normal range of motion. Neck supple. No JVD present. No tracheal deviation present. No thyromegaly present.  Cardiovascular: Normal  rate, regular rhythm, normal heart sounds and intact distal pulses.   No murmur heard. Respiratory: Effort normal and breath sounds normal. No respiratory distress. She has no wheezes. She has no rales. She exhibits no tenderness.  GI: Soft. Bowel sounds are normal. She exhibits no distension and no mass. There is tenderness (some in the RUQ, pain is always mid epigastric). There is no rebound and no guarding.  Musculoskeletal: She exhibits no edema.  Lymphadenopathy:    She has no cervical adenopathy.  Neurological: She is alert and oriented to person, place, and time. No cranial nerve deficit.  Skin: Skin is warm and dry. No rash noted. She is not diaphoretic. No erythema. No pallor.  Psychiatric: She has a normal mood and affect. Her behavior is normal. Judgment and thought content normal.    Assessment/Plan: Biliary colic with cholelithiasis Possible choledocholithiasis Situational anxiety  Plan:  Will wait for GI input, and plan cholecystectomy when they are thru with their evaluation.  She is getting ice chips so I will make NPO so she can have procedure done as needed.   Giliana Vantil 06/15/2014, 8:14 AM      

## 2014-06-15 NOTE — Progress Notes (Signed)
PROGRESS NOTE  Darlene Stevens ZOX:[id number] DOB: 05/13/1970 DOA: 06/14/2014 PCP: Darlene BallsKELLY,SAM, MD Brief history 44 year old female without any major chronic medical problems presented to the emergency department with a one-month history of colicky epigastric pain. She stated the episodes lasted 30-40 minutes associated with some nausea, but they would resolve spontaneously. On 06/13/2014, the patient states that the episode lasted for several hours and was more intense than her prior episodes. Because of the persistent pain, the patient presented to the emergency department. Workup through the emergency room with upper abdominal ultrasound showed a distended gallbladder multiple gallstones the largest 1 cm no wall thickening and CBD of 7 mm. because of symptoms of biliary colic, GI and general surgery were consulted.  Assessment/Plan: Biliary Colic/possible choledocholithiasis/Transaminasemia -appreciate GI and surgical input -plans noted for lap chole on 06/16/14 with IOC -Bili trending down -ERCP 4/15 if IOC is positive -pain control -antiemetics -CMP in am Hypokalemia -repleted -check mag Anxiety -resume home meds--Celexa     Family Communication:   Pt at beside Disposition Plan:   Home when medically stable       Procedures/Studies: [place] Abdomen Complete  06/14/2014   CLINICAL DATA:  Epigastric pain, nausea for 3 weeks. Worsening today.  EXAM: ULTRASOUND ABDOMEN COMPLETE  COMPARISON:  None.  FINDINGS: Gallbladder: Gallbladder appears distended. Multiple small mobile echogenic foci compatible with gallstones, the largest 1 cm. No wall thickening. Negative sonographic Murphy's.  Common bile duct: Diameter: Upper limits normal in caliber at 7 mm.  Liver: Suggestion of intrahepatic biliary ductal dilatation. No focal abnormality. Normal echotexture.  IVC: No abnormality visualized.  Pancreas: Visualized portion unremarkable.  Spleen: Size and appearance within normal limits.   Right Kidney: Length: 11.3 cm. Echogenicity within normal limits. No mass or hydronephrosis visualized.  Left Kidney: Length: 11.5 cm. Echogenicity within normal limits. No mass or hydronephrosis visualized.  Abdominal aorta: No aneurysm visualized.  Other findings: None.  IMPRESSION: Mild gallbladder distention with several small mobile gallstones. No wall thickening or sonographic Murphy's sign.  Borderline common bile duct diameter at 7 mm. Suggestion of mild intrahepatic biliary ductal dilatation. Recommend correlation with LFTs.   Electronically Signed   By: Charlett NoseKevin  Stevens M.D.   On: 06/14/2014 16:43         Subjective: Patient states that abdominal pain is better controlled today. Denies any chest pain, shortness breath, vomiting, diarrhea, hematochezia, melena, dysuria, hematuria.  Objective: Filed Vitals:   06/14/14 1822 06/14/14 1917 06/15/14 0554 06/15/14 1427  BP: 117/66 118/60 90/52 103/65  Pulse: 56 50 64 52  Temp: 98 F (36.7 C) 97.9 F (36.6 C) 98.2 F (36.8 C) 97.8 F (36.6 C)  TempSrc: Oral Oral Oral Oral  Resp: 18 20 16 16   Height:  5\' 8"  (1.727 m)    Weight:  62.823 kg (138 lb 8 oz) 62.823 kg (138 lb 8 oz)   SpO2: 100% 98% 98% 100%    Intake/Output Summary (Last 24 hours) at 06/15/14 1741 Last data filed at 06/15/14 1554  Gross per 24 hour  Intake 1333.33 ml  Output      0 ml  Net 1333.33 ml   Weight change:  Exam:   General:  Pt is alert, follows commands appropriately, not in acute distress  HEENT: No icterus, No thrush,  [place]/AT  Cardiovascular: RRR, S1/S2, no rubs, no gallops  Respiratory: CTA bilaterally, no wheezing, no crackles, no rhonchi  Abdomen: Soft/+BS, epigastric pain without any rebound, non  distended, no guarding  Extremities: No edema, No lymphangitis, No petechiae, No rashes, no synovitis  Data Reviewed: Basic Metabolic Panel:  Recent Labs Lab 06/14/14 1440 06/15/14 0535  NA 138 139  K 3.3* 3.9  CL 102 109  CO2 26 25    GLUCOSE 98 102*  BUN 16 17  CREATININE 0.99 0.93  CALCIUM 9.2 8.2*   Liver Function Tests:  Recent Labs Lab 06/14/14 1440 06/15/14 0535  AST 265* 232*  ALT 447* 462*  ALKPHOS 67 93  BILITOT 2.4* 1.3*  PROT 6.9 5.8*  ALBUMIN 3.9 3.4*    Recent Labs Lab 06/14/14 1440 06/15/14 0540  LIPASE 35 40   No results for input(s): AMMONIA in the last 168 hours. CBC:  Recent Labs Lab 06/14/14 1440 06/15/14 0535  WBC 7.9 5.3  NEUTROABS 5.6  --   HGB 13.7 12.5  HCT 40.7 38.6  MCV 90.8 93.2  PLT 238 231   Cardiac Enzymes: No results for input(s): CKTOTAL, CKMB, CKMBINDEX, TROPONINI in the last 168 hours. BNP: Invalid input(s): POCBNP CBG: No results for input(s): GLUCAP in the last 168 hours.  No results found for this or any previous visit (from the past 240 hour(s)).   Scheduled Meds: . citalopram  10 mg Oral Daily  . heparin  5,000 Units Subcutaneous 3 times per day   Continuous Infusions: . sodium chloride 100 mL/hr at 06/15/14 0732     Ulys Favia, DO  [facility] Pager [phone]  If 7PM-7AM, please contact night-coverage [email or web address] Password [email or web address] 06/15/2014, 5:41 PM   LOS: 1 day

## 2014-06-16 ENCOUNTER — Inpatient Hospital Stay (HOSPITAL_COMMUNITY): Payer: BLUE CROSS/BLUE SHIELD

## 2014-06-16 ENCOUNTER — Encounter (HOSPITAL_COMMUNITY): Payer: Self-pay | Admitting: Registered Nurse

## 2014-06-16 ENCOUNTER — Inpatient Hospital Stay (HOSPITAL_COMMUNITY): Payer: BLUE CROSS/BLUE SHIELD | Admitting: Registered Nurse

## 2014-06-16 ENCOUNTER — Encounter (HOSPITAL_COMMUNITY)
Admission: EM | Disposition: A | Discharge status: Home / Self Care | Payer: Self-pay | Source: Home / Self Care | Attending: Emergency Medicine

## 2014-06-16 DIAGNOSIS — K8010 Calculus of gallbladder with chronic cholecystitis without obstruction: Secondary | ICD-10-CM

## 2014-06-16 DIAGNOSIS — K8050 Calculus of bile duct without cholangitis or cholecystitis without obstruction: Secondary | ICD-10-CM | POA: Diagnosis not present

## 2014-06-16 HISTORY — PX: LAPAROSCOPIC CHOLECYSTECTOMY SINGLE SITE WITH INTRAOPERATIVE CHOLANGIOGRAM: SHX6538

## 2014-06-16 LAB — COMPREHENSIVE METABOLIC PANEL
ALBUMIN: 3.1 g/dL — AB (ref 3.5–5.2)
ALK PHOS: 81 U/L (ref 39–117)
ALT: 269 U/L — ABNORMAL HIGH (ref 0–35)
AST: 65 U/L — ABNORMAL HIGH (ref 0–37)
Anion gap: 4 — ABNORMAL LOW (ref 5–15)
BUN: 17 mg/dL (ref 6–23)
CHLORIDE: 107 mmol/L (ref 96–112)
CO2: 24 mmol/L (ref 19–32)
Calcium: 7.9 mg/dL — ABNORMAL LOW (ref 8.4–10.5)
Creatinine, Ser: 0.85 mg/dL (ref 0.50–1.10)
GFR calc Af Amer: >90 mL/min (ref >90)
GFR calc non Af Amer: 82 mL/min — ABNORMAL LOW (ref >90)
Glucose, Bld: 118 mg/dL — ABNORMAL HIGH (ref 70–99)
POTASSIUM: 4.1 mmol/L (ref 3.5–5.1)
SODIUM: 135 mmol/L (ref 135–145)
TOTAL PROTEIN: 5.3 g/dL — AB (ref 6.0–8.3)
Total Bilirubin: 0.6 mg/dL (ref 0.3–1.2)

## 2014-06-16 LAB — CBC
HEMATOCRIT: 36.6 % (ref 36.0–46.0)
Hemoglobin: 11.9 g/dL — ABNORMAL LOW (ref 12.0–15.0)
MCH: 30.5 pg (ref 26.0–34.0)
MCHC: 32.5 g/dL (ref 30.0–36.0)
MCV: 93.8 fL (ref 78.0–100.0)
Platelets: 196 10*3/uL (ref 150–400)
RBC: 3.9 MIL/uL (ref 3.87–5.11)
RDW: 13.6 % (ref 11.5–15.5)
WBC: 5.6 10*3/uL (ref 4.0–10.5)

## 2014-06-16 LAB — MAGNESIUM: Magnesium: 1.8 mg/dL (ref 1.5–2.5)

## 2014-06-16 LAB — LIPASE, BLOOD: LIPASE: 34 U/L (ref 11–59)

## 2014-06-16 SURGERY — LAPAROSCOPIC CHOLECYSTECTOMY SINGLE SITE WITH INTRAOPERATIVE CHOLANGIOGRAM
Anesthesia: General | Site: Abdomen

## 2014-06-16 MED ORDER — BUPIVACAINE-EPINEPHRINE 0.25% -1:200000 IJ SOLN
INTRAMUSCULAR | Status: DC | PRN
  Administered 2014-06-16: 20 mL

## 2014-06-16 MED ORDER — HYDROMORPHONE HCL 1 MG/ML IJ SOLN: 0.2500 mg | INTRAMUSCULAR | Status: DC | PRN

## 2014-06-16 MED ORDER — HYDROMORPHONE HCL 1 MG/ML IJ SOLN
1.0000 mg | INTRAMUSCULAR | Status: DC | PRN
  Administered 2014-06-16: 1 mg via INTRAVENOUS
  Filled 2014-06-16: qty 1

## 2014-06-16 MED ORDER — LIDOCAINE HCL (CARDIAC) 20 MG/ML IV SOLN
INTRAVENOUS | Status: AC
  Filled 2014-06-16: qty 5

## 2014-06-16 MED ORDER — ONDANSETRON HCL 4 MG PO TABS: 4.0000 mg | ORAL_TABLET | Freq: Four times a day (QID) | ORAL | Status: DC | PRN

## 2014-06-16 MED ORDER — LACTATED RINGERS IV SOLN
INTRAVENOUS | Status: DC | PRN
  Administered 2014-06-16 (×2): via INTRAVENOUS

## 2014-06-16 MED ORDER — MIDAZOLAM HCL 2 MG/2ML IJ SOLN
INTRAMUSCULAR | Status: AC
  Filled 2014-06-16: qty 2

## 2014-06-16 MED ORDER — MIDAZOLAM HCL 5 MG/5ML IJ SOLN
INTRAMUSCULAR | Status: DC | PRN
  Administered 2014-06-16: 2 mg via INTRAVENOUS

## 2014-06-16 MED ORDER — HYDROCODONE-ACETAMINOPHEN 5-325 MG PO TABS
1.0000 | ORAL_TABLET | ORAL | Status: DC | PRN
  Administered 2014-06-16 – 2014-06-17 (×5): 1 via ORAL
  Filled 2014-06-16 (×5): qty 1

## 2014-06-16 MED ORDER — GLYCOPYRROLATE 0.2 MG/ML IJ SOLN
INTRAMUSCULAR | Status: DC | PRN
  Administered 2014-06-16: 0.6 mg via INTRAVENOUS

## 2014-06-16 MED ORDER — ONDANSETRON HCL 4 MG/2ML IJ SOLN: 4.0000 mg | Freq: Four times a day (QID) | INTRAMUSCULAR | Status: DC | PRN

## 2014-06-16 MED ORDER — NEOSTIGMINE METHYLSULFATE 10 MG/10ML IV SOLN
INTRAVENOUS | Status: DC | PRN
  Administered 2014-06-16: 4 mg via INTRAVENOUS

## 2014-06-16 MED ORDER — ONDANSETRON HCL 4 MG/2ML IJ SOLN
INTRAMUSCULAR | Status: DC | PRN
  Administered 2014-06-16: 4 mg via INTRAVENOUS

## 2014-06-16 MED ORDER — SUFENTANIL CITRATE 50 MCG/ML IV SOLN
INTRAVENOUS | Status: DC | PRN
  Administered 2014-06-16: 10 ug via INTRAVENOUS
  Administered 2014-06-16: 15 ug via INTRAVENOUS
  Administered 2014-06-16 (×2): 10 ug via INTRAVENOUS

## 2014-06-16 MED ORDER — KCL IN DEXTROSE-NACL 20-5-0.45 MEQ/L-%-% IV SOLN
INTRAVENOUS | Status: DC
  Administered 2014-06-16: 13:00:00 via INTRAVENOUS
  Filled 2014-06-16 (×2): qty 1000

## 2014-06-16 MED ORDER — DEXAMETHASONE SODIUM PHOSPHATE 10 MG/ML IJ SOLN
INTRAMUSCULAR | Status: DC | PRN
  Administered 2014-06-16: 10 mg via INTRAVENOUS

## 2014-06-16 MED ORDER — PROPOFOL 10 MG/ML IV BOLUS
INTRAVENOUS | Status: AC
  Filled 2014-06-16: qty 20

## 2014-06-16 MED ORDER — DEXAMETHASONE SODIUM PHOSPHATE 10 MG/ML IJ SOLN
INTRAMUSCULAR | Status: AC
  Filled 2014-06-16: qty 1

## 2014-06-16 MED ORDER — ROCURONIUM BROMIDE 100 MG/10ML IV SOLN
INTRAVENOUS | Status: DC | PRN
  Administered 2014-06-16: 5 mg via INTRAVENOUS
  Administered 2014-06-16: 35 mg via INTRAVENOUS

## 2014-06-16 MED ORDER — PROMETHAZINE HCL 25 MG/ML IJ SOLN: 6.2500 mg | INTRAMUSCULAR | Status: DC | PRN

## 2014-06-16 MED ORDER — LACTATED RINGERS IR SOLN
Status: DC | PRN
  Administered 2014-06-16: 1000 mL

## 2014-06-16 MED ORDER — IOHEXOL 300 MG/ML  SOLN
INTRAMUSCULAR | Status: DC | PRN
  Administered 2014-06-16: 7.5 mL

## 2014-06-16 MED ORDER — BUPIVACAINE-EPINEPHRINE (PF) 0.25% -1:200000 IJ SOLN
INTRAMUSCULAR | Status: AC
  Filled 2014-06-16: qty 30

## 2014-06-16 MED ORDER — PROPOFOL 10 MG/ML IV BOLUS
INTRAVENOUS | Status: DC | PRN
  Administered 2014-06-16: 200 mg via INTRAVENOUS

## 2014-06-16 MED ORDER — NEOSTIGMINE METHYLSULFATE 10 MG/10ML IV SOLN
INTRAVENOUS | Status: AC
  Filled 2014-06-16: qty 1

## 2014-06-16 MED ORDER — ROCURONIUM BROMIDE 100 MG/10ML IV SOLN
INTRAVENOUS | Status: AC
  Filled 2014-06-16: qty 1

## 2014-06-16 MED ORDER — SUFENTANIL CITRATE 50 MCG/ML IV SOLN
INTRAVENOUS | Status: AC
  Filled 2014-06-16: qty 1

## 2014-06-16 MED ORDER — LACTATED RINGERS IV SOLN
INTRAVENOUS | Status: DC
  Administered 2014-06-16: 1000 mL via INTRAVENOUS

## 2014-06-16 MED ORDER — LIDOCAINE HCL (CARDIAC) 20 MG/ML IV SOLN
INTRAVENOUS | Status: DC | PRN
Start: 2014-06-16 — End: 2014-06-16
  Administered 2014-06-16: 75 mg via INTRAVENOUS
  Administered 2014-06-16: 25 mg via INTRATRACHEAL

## 2014-06-16 MED ORDER — SUCCINYLCHOLINE CHLORIDE 20 MG/ML IJ SOLN
INTRAMUSCULAR | Status: DC | PRN
  Administered 2014-06-16: 100 mg via INTRAVENOUS

## 2014-06-16 MED ORDER — CEFAZOLIN SODIUM-DEXTROSE 2-3 GM-% IV SOLR
INTRAVENOUS | Status: AC
  Filled 2014-06-16: qty 50

## 2014-06-16 MED ORDER — ONDANSETRON HCL 4 MG/2ML IJ SOLN
INTRAMUSCULAR | Status: AC
  Filled 2014-06-16: qty 2

## 2014-06-16 MED ORDER — CEFAZOLIN SODIUM-DEXTROSE 2-3 GM-% IV SOLR
2.0000 g | Freq: Once | INTRAVENOUS | Status: DC
  Filled 2014-06-16: qty 50

## 2014-06-16 MED ORDER — 0.9 % SODIUM CHLORIDE (POUR BTL) OPTIME
TOPICAL | Status: DC | PRN
  Administered 2014-06-16: 1000 mL

## 2014-06-16 MED ORDER — GLYCOPYRROLATE 0.2 MG/ML IJ SOLN
INTRAMUSCULAR | Status: AC
  Filled 2014-06-16: qty 3

## 2014-06-16 MED ORDER — ACETAMINOPHEN 325 MG PO TABS: 650.0000 mg | ORAL_TABLET | ORAL | Status: DC | PRN

## 2014-06-16 SURGICAL SUPPLY — 36 items
APPLIER CLIP ROT 10 11.4 M/L (STAPLE) ×3
BENZOIN TINCTURE PRP APPL 2/3 (GAUZE/BANDAGES/DRESSINGS) ×3 IMPLANT
CABLE HIGH FREQUENCY MONO STRZ (ELECTRODE) ×3 IMPLANT
CLIP APPLIE ROT 10 11.4 M/L (STAPLE) ×1 IMPLANT
CLOSURE WOUND 1/2 X4 (GAUZE/BANDAGES/DRESSINGS) ×1
COVER MAYO STAND STRL (DRAPES) ×3 IMPLANT
DECANTER SPIKE VIAL GLASS SM (MISCELLANEOUS) IMPLANT
DRAPE C-ARM 42X120 X-RAY (DRAPES) ×3 IMPLANT
DRAPE LAPAROSCOPIC ABDOMINAL (DRAPES) ×3 IMPLANT
DRAPE UTILITY XL STRL (DRAPES) ×3 IMPLANT
ELECT REM PT RETURN 9FT ADLT (ELECTROSURGICAL) ×3
ELECTRODE REM PT RTRN 9FT ADLT (ELECTROSURGICAL) ×1 IMPLANT
GLOVE BIOGEL PI IND STRL 6.5 (GLOVE) ×1 IMPLANT
GLOVE BIOGEL PI IND STRL 7.0 (GLOVE) ×1 IMPLANT
GLOVE BIOGEL PI INDICATOR 6.5 (GLOVE) ×2
GLOVE BIOGEL PI INDICATOR 7.0 (GLOVE) ×2
GLOVE SURG ORTHO 8.0 STRL STRW (GLOVE) ×3 IMPLANT
GLOVE SURG SS PI 7.0 STRL IVOR (GLOVE) ×3 IMPLANT
GLOVE SURG SS PI 7.5 STRL IVOR (GLOVE) ×3 IMPLANT
GOWN STRL REUS W/TWL XL LVL3 (GOWN DISPOSABLE) ×12 IMPLANT
HEMOSTAT SURGICEL 4X8 (HEMOSTASIS) IMPLANT
KIT BASIN OR (CUSTOM PROCEDURE TRAY) ×3 IMPLANT
NS IRRIG 1000ML POUR BTL (IV SOLUTION) ×3 IMPLANT
POUCH SPECIMEN RETRIEVAL 10MM (ENDOMECHANICALS) ×3 IMPLANT
SCISSORS LAP 5X35 DISP (ENDOMECHANICALS) ×3 IMPLANT
SET CHOLANGIOGRAPH MIX (MISCELLANEOUS) ×3 IMPLANT
SET IRRIG TUBING LAPAROSCOPIC (IRRIGATION / IRRIGATOR) ×3 IMPLANT
SLEEVE XCEL OPT CAN 5 100 (ENDOMECHANICALS) ×3 IMPLANT
STRIP CLOSURE SKIN 1/2X4 (GAUZE/BANDAGES/DRESSINGS) ×2 IMPLANT
SUT VIC AB 4-0 PS2 27 (SUTURE) ×3 IMPLANT
TOWEL OR 17X26 10 PK STRL BLUE (TOWEL DISPOSABLE) ×3 IMPLANT
TOWEL OR NON WOVEN STRL DISP B (DISPOSABLE) ×3 IMPLANT
TRAY LAPAROSCOPIC (CUSTOM PROCEDURE TRAY) ×3 IMPLANT
TROCAR BLADELESS OPT 5 100 (ENDOMECHANICALS) ×3 IMPLANT
TROCAR XCEL BLUNT TIP 100MML (ENDOMECHANICALS) ×3 IMPLANT
TROCAR XCEL NON-BLD 11X100MML (ENDOMECHANICALS) ×3 IMPLANT

## 2014-06-16 NOTE — Transfer of Care (Signed)
Immediate Anesthesia Transfer of Care Note  Patient: Darlene Stevens  Procedure(s) Performed: Procedure(s): LAPAROSCOPIC CHOLECYSTECTOMY  WITH INTRAOPERATIVE CHOLANGIOGRAM (N/A)  Patient Location: PACU  Anesthesia Type:General  Level of Consciousness: awake, alert , oriented and patient cooperative  Airway & Oxygen Therapy: Patient Spontanous Breathing and Patient connected to face mask oxygen  Post-op Assessment: Report given to RN, Post -op Vital signs reviewed and stable and Patient moving all extremities X 4  Post vital signs: stable  Last Vitals:  Filed Vitals:   06/16/14 1042  BP: 124/58  Pulse: 74  Temp:   Resp: 10    Complications: No apparent anesthesia complications

## 2014-06-16 NOTE — Anesthesia Preprocedure Evaluation (Signed)
Anesthesia Evaluation  Patient identified by MRN, date of birth, ID band Patient awake    Reviewed: Allergy & Precautions, NPO status , Patient's Chart, lab work & pertinent test results  Airway Mallampati: II  TM Distance: >3 FB Neck ROM: Full    Dental   Pulmonary neg pulmonary ROS,  breath sounds clear to auscultation        Cardiovascular negative cardio ROS  Rhythm:Regular Rate:Normal     Neuro/Psych    GI/Hepatic Neg liver ROS, GI history noted. CE   Endo/Other  negative endocrine ROS  Renal/GU negative Renal ROS     Musculoskeletal   Abdominal   Peds  Hematology   Anesthesia Other Findings   Reproductive/Obstetrics                             Anesthesia Physical Anesthesia Plan  ASA: II  Anesthesia Plan: General   Post-op Pain Management:    Induction: Intravenous  Airway Management Planned: Oral ETT  Additional Equipment:   Intra-op Plan:   Post-operative Plan: Extubation in OR  Informed Consent: I have reviewed the patients History and Physical, chart, labs and discussed the procedure including the risks, benefits and alternatives for the proposed anesthesia with the patient or authorized representative who has indicated his/her understanding and acceptance.   Dental advisory given  Plan Discussed with: CRNA and Anesthesiologist  Anesthesia Plan Comments:         Anesthesia Quick Evaluation

## 2014-06-16 NOTE — Interval H&P Note (Signed)
History and Physical Interval Note:  06/16/2014 9:01 AM  Darlene Stevens  has presented today for surgery, with the diagnosis of Cholelithasis.  The various methods of treatment have been discussed with the patient and family. After consideration of risks, benefits and other options for treatment, the patient has consented to    Procedure(s): LAPAROSCOPIC CHOLECYSTECTOMY SINGLE SITE WITH INTRAOPERATIVE CHOLANGIOGRAM (N/A) as a surgical intervention .    The patient's history has been reviewed, patient examined, no change in status, stable for surgery.  I have reviewed the patient's chart and labs.  Questions were answered to the patient's satisfaction.    Velora Hecklerodd M. Dwaine Pringle, MD, [facility] Office: [phone]    Zaniya Mcaulay MJudie Petit

## 2014-06-16 NOTE — Anesthesia Procedure Notes (Signed)
Procedure Name: Intubation Performed by: Anastasio ChampionEVANS, Chavonne Sforza E Pre-anesthesia Checklist: Patient identified, Emergency Drugs available, Suction available and Patient being monitored Patient Re-evaluated:Patient Re-evaluated prior to inductionOxygen Delivery Method: Circle System Utilized Preoxygenation: Pre-oxygenation with 100% oxygen Intubation Type: IV induction Ventilation: Mask ventilation without difficulty Laryngoscope Size: Mac and 3 Grade View: Grade II Tube type: Oral Tube size: 7.0 mm Number of attempts: 1 Airway Equipment and Method: Stylet and Oral airway Placement Confirmation: ETT inserted through vocal cords under direct vision,  positive ETCO2 and breath sounds checked- equal and bilateral Secured at: 21 cm Tube secured with: Tape Dental Injury: Teeth and Oropharynx as per pre-operative assessment

## 2014-06-16 NOTE — Op Note (Signed)
Procedure Note  Pre-operative Diagnosis:  Cholelithiasis, abnormal LFT's, rule out choledocholithiasis  Post-operative Diagnosis:  same  Surgeon:  Darlene Hecklerodd M. Carla Whilden, MD, FACS  Assistant:  none   Procedure:  Laparoscopic cholecystectomy with intra-operative cholangiography  Anesthesia:  General  Estimated Blood Loss:  minimal  Drains: none         Specimen: Gallbladder to pathology  Indications:  Patient presents with abdominal pain and elevated LFT's, mildly dilated common bile duct, cholelithiasis.  Procedure Details:  The patient was seen in the pre-op holding area. The risks, benefits, complications, treatment options, and expected outcomes were previously discussed with the patient. The patient agreed with the proposed plan and has signed the informed consent form.  The patient was brought to the Operating Room, identified as Darlene Stevens and the procedure verified as laparoscopic cholecystectomy with intraoperative cholangiography. A "time out" was completed and the above information confirmed.  The patient was placed in the supine position. Following induction of general anesthesia, the abdomen was prepped and draped in the usual aseptic fashion.  An incision was made in the skin near the umbilicus. The midline fascia was incised and the peritoneal cavity was entered and a Hasson canula was introduced under direct vision.  The Hasson canula was secured with a 0-Vicryl pursestring suture. Pneumoperitoneum was established with carbon dioxide. Additional trocars were introduced under direct vision along the right costal margin in the midline, mid-clavicular line, and anterior axillary line.   The gallbladder was identified and the fundus grasped and retracted cephalad. Adhesions were taken down bluntly and the electrocautery was utilized as needed, taking care not to injure any adjacent structures. The infundibulum was grasped and retracted laterally, exposing the peritoneum overlying  the triangle of Calot. The peritoneum was incised and structures exposed with blunt dissection. The cystic duct was clearly identified, bluntly dissected circumferentially, and clipped at the neck of the gallbladder.  An incision was made in the cystic duct and the cholangiogram catheter introduced. The catheter was secured using an ligaclip.  Real-time cholangiography was performed using C-arm fluoroscopy.  There was rapid filling of a normal caliber common bile duct.  There was reflux of contrast into the left and right hepatic ductal systems.  There was free flow distally into the duodenum without filling defect or obstruction.  The catheter was removed from the peritoneal cavity.  The cystic duct was then ligated with surgical clips and divided. The cystic artery was identified, dissected circumferentially, ligated with ligaclips, and divided.  The gallbladder was dissected away from the liver bed using the electrocautery for hemostasis. The gallbladder was completely removed from the liver and placed into an endocatch bag. The gallbladder was removed in the endocatch bag through the umbilical port site and submitted to pathology for review.  The right upper quadrant was irrigated and the gallbladder bed was inspected. Hemostasis was achieved with the electrocautery.  Pneumoperitoneum was released after viewing removal of the trocars with good hemostasis noted. The umbilical wound was irrigated and the fascia was then closed with the pursestring suture.  Local anesthetic was infiltrated at all port sites. The skin incisions were closed with 4-0 Monocril subcuticular sutures and steri-strips and dressings were applied.  Instrument, sponge, and needle counts were correct at the conclusion of the case.  The patient was awakened from anesthesia and brought to the recovery room in stable condition.  The patient tolerated the procedure well.   Darlene Hecklerodd M. Dorsel Flinn, MD, [facility] Office: [phone]

## 2014-06-16 NOTE — Progress Notes (Addendum)
PROGRESS NOTE  Darlene MarkerMary Stevens ZOX:[id number] DOB: 06/20/1970 DOA: 06/14/2014 PCP: Almedia BallsKELLY,SAM, MD  Brief history 44 year old female without any major chronic medical problems presented to the emergency department with a one-month history of colicky epigastric pain. She stated the episodes lasted 30-40 minutes associated with some nausea, but they would resolve spontaneously. On 06/13/2014, the patient states that the episode lasted for several hours and was more intense than her prior episodes. Because of the persistent pain, the patient presented to the emergency department. Workup through the emergency room with upper abdominal ultrasound showed a distended gallbladder multiple gallstones the largest 1 cm no wall thickening and CBD of 7 mm. because of symptoms of biliary colic, GI and general surgery were consulted. The patient underwent laparoscopic cholecystectomy on 06/16/2014. Intraoperative cholangiogram was negative for any obstruction.  Assessment/Plan: Biliary Colic/cholelithiasis with possible choledocholithiasis/Transaminasemia -appreciate GI and surgical input -Lap chole on 06/16/14--IOC-->patent biliary tree -Bili trending down -pain control  -antiemetics -CMP in am -await pathology -full liquids today Hypokalemia -repleted -check mag Anxiety -resume home meds--Celexa   Family Communication: No family at beside Disposition Plan: Home 06/17/14 if stable       Procedures/Studies: Dg Cholangiogram Operative  06/16/2014   CLINICAL DATA:  Cholelithiasis  EXAM: INTRAOPERATIVE CHOLANGIOGRAM  TECHNIQUE: Cholangiographic images from the C-arm fluoroscopic device were submitted for interpretation post-operatively. Please see the procedural report for the amount of contrast and the fluoroscopy time utilized.  COMPARISON:  None.  FINDINGS: Contrast fills the biliary tree without filling defects in the common bile duct.  IMPRESSION: Patent biliary tree without evidence of  common bile duct stones.   Electronically Signed   By: Jolaine ClickArthur  Hoss M.D.   On: 06/16/2014 10:32   [place] Abdomen Complete  06/14/2014   CLINICAL DATA:  Epigastric pain, nausea for 3 weeks. Worsening today.  EXAM: ULTRASOUND ABDOMEN COMPLETE  COMPARISON:  None.  FINDINGS: Gallbladder: Gallbladder appears distended. Multiple small mobile echogenic foci compatible with gallstones, the largest 1 cm. No wall thickening. Negative sonographic Murphy's.  Common bile duct: Diameter: Upper limits normal in caliber at 7 mm.  Liver: Suggestion of intrahepatic biliary ductal dilatation. No focal abnormality. Normal echotexture.  IVC: No abnormality visualized.  Pancreas: Visualized portion unremarkable.  Spleen: Size and appearance within normal limits.  Right Kidney: Length: 11.3 cm. Echogenicity within normal limits. No mass or hydronephrosis visualized.  Left Kidney: Length: 11.5 cm. Echogenicity within normal limits. No mass or hydronephrosis visualized.  Abdominal aorta: No aneurysm visualized.  Other findings: None.  IMPRESSION: Mild gallbladder distention with several small mobile gallstones. No wall thickening or sonographic Murphy's sign.  Borderline common bile duct diameter at 7 mm. Suggestion of mild intrahepatic biliary ductal dilatation. Recommend correlation with LFTs.   Electronically Signed   By: Charlett NoseKevin  Dover M.D.   On: 06/14/2014 16:43         Subjective: Patient denies fevers, chills, chest pain, shortness breath, vomiting, diarrhea. She has some abdominal pain at the incision sites. No dysuria or hematuria.  Objective: Filed Vitals:   06/16/14 1115 06/16/14 1130 06/16/14 1147 06/16/14 1401  BP: 98/52 91/54 103/53 108/57  Pulse: 69 61 57 57  Temp:  98.8 F (37.1 C) 98.4 F (36.9 C) 97.8 F (36.6 C)  TempSrc:    Oral  Resp: 17 15 16 20   Height:      Weight:      SpO2: 94% 94% 95% 99%    Intake/Output Summary (Last  24 hours) at 06/16/14 1905 Last data filed at 06/16/14 1130  Gross per  24 hour  Intake   1100 ml  Output      0 ml  Net   1100 ml   Weight change: -0.544 kg (-1 lb 3.2 oz) Exam:   General:  Pt is alert, follows commands appropriately, not in acute distress  HEENT: No icterus, No thrush,  [place]/AT  Cardiovascular: RRR, S1/S2, no rubs, no gallops  Respiratory: CTA bilaterally, no wheezing, no crackles, no rhonchi  Abdomen: Soft/+BS, non tender, non distended, no guarding  Extremities: No edema, No lymphangitis, No petechiae, No rashes, no synovitis  Data Reviewed: Basic Metabolic Panel:  Recent Labs Lab 06/14/14 1440 06/15/14 0535 06/16/14 0510  NA 138 139 135  K 3.3* 3.9 4.1  CL 102 109 107  CO2 GLUCOSE 98 102* 118*  BUN CREATININE 0.99 0.93 0.85  CALCIUM 9.2 8.2* 7.9*  MG  --   --  1.8   Liver Function Tests:  Recent Labs Lab 06/14/14 1440 06/15/14 0535 06/16/14 0510  AST 265* 232* 65*  ALT 447* 462* 269*  ALKPHOS 67 93 81  BILITOT 2.4* 1.3* 0.6  PROT 6.9 5.8* 5.3*  ALBUMIN 3.9 3.4* 3.1*    Recent Labs Lab 06/14/14 1440 06/15/14 0540 06/16/14 0510  LIPASE 35 40 34   No results for input(s): AMMONIA in the last 168 hours. CBC:  Recent Labs Lab 06/14/14 1440 06/15/14 0535 06/16/14 0510  WBC 7.9 5.3 5.6  NEUTROABS 5.6  --   --   HGB 13.7 12.5 11.9*  HCT 40.7 38.6 36.6  MCV 90.8 93.2 93.8  PLT 238 231 196   Cardiac Enzymes: No results for input(s): CKTOTAL, CKMB, CKMBINDEX, TROPONINI in the last 168 hours. BNP: Invalid input(s): POCBNP CBG: No results for input(s): GLUCAP in the last 168 hours.  No results found for this or any previous visit (from the past 240 hour(s)).   Scheduled Meds: . citalopram  10 mg Oral Daily   Continuous Infusions: . dextrose 5 % and 0.45 % NaCl with KCl 20 mEq/L 50 mL/hr at 06/16/14 1237     Dimetrius Montfort, DO  [facility] Pager [phone]  If 7PM-7AM, please contact night-coverage [email or web address] Password [email or web address] 06/16/2014, 7:05 PM   LOS: 2 days

## 2014-06-16 NOTE — H&P (View-Only) (Signed)
Reason for Consult: Abdominal pain on and off for 4 weeks Referring Physician:  Riley Kill is an 44 y.o. female.   HPI: 44 y/o seen with intermittent pain on and off for 3 weeks.  She doesn't know what brings in on.  Last about 30-40 minutes.  Not relieved by ibuprofen or Tums. 1st two spells occurred in rapid succession, then she did ok for a time.  2nd time a single episode, the 3rd time 2 episodes in a row.  Yesterday it started about 10:30 AM and lasted till 4 PM.  Sx would wax and wane.   Pain mid epigastric region, some nausea, no vomiting, no fever, severity would wax and wane.  Work up at [facility]: Afebrile, VSS, T bil 2.4 AST 265,ALT 447, lipase 35 last PM.  WBC 7.9.  Ultrasound shows:  Gallbladder appears distended. Multiple small mobile echogenic foci compatible with gallstones, the largest 1 cm. No wall thickening. Negative sonographic Murphy's.  Common bile duct: Diameter: Upper limits normal in caliber at 7 mm. Liver: Suggestion of intrahepatic biliary ductal dilatation. No focal abnormality. Normal echotexture.  She was admitted by Medicine last evening and we are ask to see.  Past Medical History  Diagnosis Date  . Anxiety     Past Surgical History  Procedure Laterality Date  . Cesarean section    . Tonsillectomy    . Eye surgery    . Tubal ligation    . Bunionectomy      History reviewed. No pertinent family history.  Social History:  reports that she has never smoked. She does not have any smokeless tobacco history on file. She reports that she does not drink alcohol or use illicit drugs.  Allergies: No Known Allergies  Prior to Admission medications   Medication Sig Start Date End Date Taking? Authorizing Provider  ALPRAZolam Duanne Moron) 0.5 MG tablet Take 0.5 mg by mouth at bedtime as needed for anxiety or sleep.   Yes Historical Provider, MD  citalopram (CELEXA) 10 MG tablet Take 10 mg by mouth every morning.   Yes Historical Provider, MD      Results for orders placed or performed during the hospital encounter of 06/14/14 (from the past 48 hour(s))  Comprehensive metabolic panel     Status: Abnormal   Collection Time: 06/14/14  2:40 PM  Result Value Ref Range   Sodium 138 135 - 145 mmol/L   Potassium 3.3 (L) 3.5 - 5.1 mmol/L   Chloride 102 96 - 112 mmol/L   CO2 26 19 - 32 mmol/L   Glucose, Bld 98 70 - 99 mg/dL   BUN 16 6 - 23 mg/dL   Creatinine, Ser 0.99 0.50 - 1.10 mg/dL   Calcium 9.2 8.4 - 10.5 mg/dL   Total Protein 6.9 6.0 - 8.3 g/dL   Albumin 3.9 3.5 - 5.2 g/dL   AST 265 (H) 0 - 37 U/L   ALT 447 (H) 0 - 35 U/L   Alkaline Phosphatase 67 39 - 117 U/L   Total Bilirubin 2.4 (H) 0.3 - 1.2 mg/dL   GFR calc non Af Amer 68 (L) >90 mL/min   GFR calc Af Amer 79 (L) >90 mL/min    Comment: (NOTE) The eGFR has been calculated using the CKD EPI equation. This calculation has not been validated in all clinical situations. eGFR's persistently <90 mL/min signify possible Chronic Kidney Disease.    Anion gap 10 5 - 15  CBC with Differential  Status: None   Collection Time: 06/14/14  2:40 PM  Result Value Ref Range   WBC 7.9 4.0 - 10.5 K/uL   RBC 4.48 3.87 - 5.11 MIL/uL   Hemoglobin 13.7 12.0 - 15.0 g/dL   HCT 40.7 36.0 - 46.0 %   MCV 90.8 78.0 - 100.0 fL   MCH 30.6 26.0 - 34.0 pg   MCHC 33.7 30.0 - 36.0 g/dL   RDW 12.8 11.5 - 15.5 %   Platelets 238 150 - 400 K/uL   Neutrophils Relative % 71 43 - 77 %   Neutro Abs 5.6 1.7 - 7.7 K/uL   Lymphocytes Relative 21 12 - 46 %   Lymphs Abs 1.7 0.7 - 4.0 K/uL   Monocytes Relative 7 3 - 12 %   Monocytes Absolute 0.5 0.1 - 1.0 K/uL   Eosinophils Relative 1 0 - 5 %   Eosinophils Absolute 0.1 0.0 - 0.7 K/uL   Basophils Relative 0 0 - 1 %   Basophils Absolute 0.0 0.0 - 0.1 K/uL  Lipase, blood     Status: None   Collection Time: 06/14/14  2:40 PM  Result Value Ref Range   Lipase 35 11 - 59 U/L  Comprehensive metabolic panel     Status: Abnormal   Collection Time:  06/15/14  5:35 AM  Result Value Ref Range   Sodium 139 135 - 145 mmol/L   Potassium 3.9 3.5 - 5.1 mmol/L   Chloride 109 96 - 112 mmol/L   CO2 25 19 - 32 mmol/L   Glucose, Bld 102 (H) 70 - 99 mg/dL   BUN 17 6 - 23 mg/dL   Creatinine, Ser 0.93 0.50 - 1.10 mg/dL   Calcium 8.2 (L) 8.4 - 10.5 mg/dL   Total Protein 5.8 (L) 6.0 - 8.3 g/dL   Albumin 3.4 (L) 3.5 - 5.2 g/dL   AST 232 (H) 0 - 37 U/L   ALT 462 (H) 0 - 35 U/L   Alkaline Phosphatase 93 39 - 117 U/L   Total Bilirubin 1.3 (H) 0.3 - 1.2 mg/dL   GFR calc non Af Amer 73 (L) >90 mL/min   GFR calc Af Amer 85 (L) >90 mL/min    Comment: (NOTE) The eGFR has been calculated using the CKD EPI equation. This calculation has not been validated in all clinical situations. eGFR's persistently <90 mL/min signify possible Chronic Kidney Disease.    Anion gap 5 5 - 15  CBC     Status: None   Collection Time: 06/15/14  5:35 AM  Result Value Ref Range   WBC 5.3 4.0 - 10.5 K/uL   RBC 4.14 3.87 - 5.11 MIL/uL   Hemoglobin 12.5 12.0 - 15.0 g/dL   HCT 38.6 36.0 - 46.0 %   MCV 93.2 78.0 - 100.0 fL   MCH 30.2 26.0 - 34.0 pg   MCHC 32.4 30.0 - 36.0 g/dL   RDW 13.4 11.5 - 15.5 %   Platelets 231 150 - 400 K/uL  Lipase, blood     Status: None   Collection Time: 06/15/14  5:40 AM  Result Value Ref Range   Lipase 40 11 - 59 U/L    US Abdomen Complete  06/14/2014   CLINICAL DATA:  Epigastric pain, nausea for 3 weeks. Worsening today.  EXAM: ULTRASOUND ABDOMEN COMPLETE  COMPARISON:  None.  FINDINGS: Gallbladder: Gallbladder appears distended. Multiple small mobile echogenic foci compatible with gallstones, the largest 1 cm. No wall thickening. Negative sonographic Murphy's.  Common bile  duct: Diameter: Upper limits normal in caliber at 7 mm.  Liver: Suggestion of intrahepatic biliary ductal dilatation. No focal abnormality. Normal echotexture.  IVC: No abnormality visualized.  Pancreas: Visualized portion unremarkable.  Spleen: Size and appearance within  normal limits.  Right Kidney: Length: 11.3 cm. Echogenicity within normal limits. No mass or hydronephrosis visualized.  Left Kidney: Length: 11.5 cm. Echogenicity within normal limits. No mass or hydronephrosis visualized.  Abdominal aorta: No aneurysm visualized.  Other findings: None.  IMPRESSION: Mild gallbladder distention with several small mobile gallstones. No wall thickening or sonographic Murphy's sign.  Borderline common bile duct diameter at 7 mm. Suggestion of mild intrahepatic biliary ductal dilatation. Recommend correlation with LFTs.   Electronically Signed   By: Rolm Baptise M.D.   On: 06/14/2014 16:43    Review of Systems  Constitutional: Negative for fever, chills, weight loss, malaise/fatigue and diaphoresis.       She works out 5 days a week.  HENT: Negative.   Eyes: Negative.        Needs reading glasses now.  Respiratory: Negative.   Cardiovascular: Negative.   Gastrointestinal: Positive for nausea and abdominal pain. Negative for heartburn, vomiting, diarrhea, constipation, blood in stool and melena.  Genitourinary: Negative.   Musculoskeletal: Negative.   Skin: Negative.   Neurological: Negative.  Negative for weakness.  Endo/Heme/Allergies: Negative.   Psychiatric/Behavioral: The patient is nervous/anxious (going thru divorce/seperation).    Blood pressure 90/52, pulse 64, temperature 98.2 F (36.8 C), temperature source Oral, resp. rate 16, height 5' 8" (1.727 m), weight 62.823 kg (138 lb 8 oz), SpO2 98 %. Physical Exam  Constitutional: She is oriented to person, place, and time. She appears well-developed and well-nourished. No distress.  HENT:  Head: Normocephalic and atraumatic.  Nose: Nose normal.  Eyes: Conjunctivae and EOM are normal. Right eye exhibits no discharge. Left eye exhibits no discharge. No scleral icterus.  Neck: Normal range of motion. Neck supple. No JVD present. No tracheal deviation present. No thyromegaly present.  Cardiovascular: Normal  rate, regular rhythm, normal heart sounds and intact distal pulses.   No murmur heard. Respiratory: Effort normal and breath sounds normal. No respiratory distress. She has no wheezes. She has no rales. She exhibits no tenderness.  GI: Soft. Bowel sounds are normal. She exhibits no distension and no mass. There is tenderness (some in the RUQ, pain is always mid epigastric). There is no rebound and no guarding.  Musculoskeletal: She exhibits no edema.  Lymphadenopathy:    She has no cervical adenopathy.  Neurological: She is alert and oriented to person, place, and time. No cranial nerve deficit.  Skin: Skin is warm and dry. No rash noted. She is not diaphoretic. No erythema. No pallor.  Psychiatric: She has a normal mood and affect. Her behavior is normal. Judgment and thought content normal.    Assessment/Plan: Biliary colic with cholelithiasis Possible choledocholithiasis Situational anxiety  Plan:  Will wait for GI input, and plan cholecystectomy when they are thru with their evaluation.  She is getting ice chips so I will make NPO so she can have procedure done as needed.   JENNINGS,WILLARD 06/15/2014, 8:14 AM

## 2014-06-16 NOTE — Anesthesia Postprocedure Evaluation (Signed)
  Anesthesia Post-op Note  Patient: Darlene Stevens  Procedure(s) Performed: Procedure(s): LAPAROSCOPIC CHOLECYSTECTOMY  WITH INTRAOPERATIVE CHOLANGIOGRAM (N/A)  Patient Location: PACU  Anesthesia Type:General  Level of Consciousness: awake  Airway and Oxygen Therapy: Patient Spontanous Breathing  Post-op Pain: mild  Post-op Assessment: Post-op Vital signs reviewed  Post-op Vital Signs: Reviewed  Last Vitals:  Filed Vitals:   06/16/14 1401  BP: 108/57  Pulse: 57  Temp: 36.6 C  Resp: 20    Complications: No apparent anesthesia complications

## 2014-06-16 NOTE — Discharge Instructions (Signed)
[facility] ______[facility] °LAPAROSCOPIC SURGERY: POST OP INSTRUCTIONS °Always review your discharge instruction sheet given to you by the facility where your surgery was performed. °IF YOU HAVE DISABILITY OR FAMILY LEAVE FORMS, YOU MUST BRING THEM TO THE OFFICE FOR PROCESSING.   °DO NOT GIVE THEM TO YOUR DOCTOR. ° °1. A prescription for pain medication may be given to you upon discharge.  Take your pain medication as prescribed, if needed.  If narcotic pain medicine is not needed, then you may take acetaminophen (Tylenol) or ibuprofen (Advil) as needed. °2. Take your usually prescribed medications unless otherwise directed. °3. If you need a refill on your pain medication, please contact your pharmacy.  They will contact our office to request authorization. Prescriptions will not be filled after 5pm or on week-ends. °4. You should follow a light diet the first few days after arrival home, such as soup and crackers, etc.  Be sure to include lots of fluids daily. °5. Most patients will experience some swelling and bruising in the area of the incisions.  Ice packs will help.  Swelling and bruising can take several days to resolve.  °6. It is common to experience some constipation if taking pain medication after surgery.  Increasing fluid intake and taking a stool softener (such as Colace) will usually help or prevent this problem from occurring.  A mild laxative (Milk of Magnesia or Miralax) should be taken according to package instructions if there are no bowel movements after 48 hours. °7. Unless discharge instructions indicate otherwise, you may remove your bandages 24-48 hours after surgery, and you may shower at that time.  You may have steri-strips (small skin tapes) in place directly over the incision.  These strips should be left on the skin for 7-10 days.  If your surgeon used skin glue on the incision, you may shower in 24 hours.  The glue will flake off over the next 2-3 weeks.  Any sutures or  staples will be removed at the office during your follow-up visit. °8. ACTIVITIES:  You may resume regular (light) daily activities beginning the next day--such as daily self-care, walking, climbing stairs--gradually increasing activities as tolerated.  You may have sexual intercourse when it is comfortable.  Refrain from any heavy lifting or straining until approved by your doctor. °a. You may drive when you are no longer taking prescription pain medication, you can comfortably wear a seatbelt, and you can safely maneuver your car and apply brakes. °b. RETURN TO WORK:  __________________________________________________________ °9. You should see your doctor in the office for a follow-up appointment approximately 2-3 weeks after your surgery.  Make sure that you call for this appointment within a day or two after you arrive home to insure a convenient appointment time. °10. OTHER INSTRUCTIONS: __________________________________________________________________________________________________________________________ __________________________________________________________________________________________________________________________ °WHEN TO CALL YOUR DOCTOR: °1. Fever over 101.0 °2. Inability to urinate °3. Continued bleeding from incision. °4. Increased pain, redness, or drainage from the incision. °5. Increasing abdominal pain ° °The clinic staff is available to answer your questions during regular business hours.  Please don’t hesitate to call and ask to speak to one of the nurses for clinical concerns.  If you have a medical emergency, go to the nearest emergency room or call 911.  A surgeon from [facility] is always on call at the hospital. °[address], [address], [place], [place]  [address] ? [address] [address], [place], [place]   [address] °[phone] ? [phone] ? FAX [phone] °Web site:   [email or web address] °Laparoscopic Cholecystectomy, Care After °Refer to this  sheet in the next few weeks. These instructions provide you with information on caring for yourself after your procedure. Your health care provider may also give you more specific instructions. Your treatment has been planned according to current medical practices, but problems sometimes occur. Call your health care provider if you have any problems or questions after your procedure. °WHAT TO EXPECT AFTER THE PROCEDURE °After your procedure, it is typical to have the following: °· Pain at your incision sites. You will be given pain medicines to control the pain. °· Mild nausea or vomiting. This should improve after the first 24 hours. °· Bloating and possibly shoulder pain from the gas used during the procedure. This will improve after the first 24 hours. °HOME CARE INSTRUCTIONS  °· Change bandages (dressings) as directed by your health care provider. °· Keep the wound dry and clean. You may wash the wound gently with soap and water. Gently blot or dab the area dry. °· Do not take baths or use swimming pools or hot tubs for 2 weeks or until your health care provider approves. °· Only take over-the-counter or prescription medicines as directed by your health care provider. °· Continue your normal diet as directed by your health care provider. °· Do not lift anything heavier than 10 pounds (4.5 kg) until your health care provider approves. °· Do not play contact sports for 1 week or until your health care provider approves. °SEEK MEDICAL CARE IF:  °· You have redness, swelling, or increasing pain in the wound. °· You notice yellowish-white fluid (pus) coming from the wound. °· You have drainage from the wound that lasts longer than 1 day. °· You notice a bad smell coming from the wound or dressing. °· Your surgical cuts (incisions) break open. °SEEK IMMEDIATE MEDICAL CARE IF:  °· You develop a rash. °· You have difficulty breathing. °· You have chest pain. °· You have a fever. °· You have increasing pain in the  shoulders (shoulder strap areas). °· You have dizzy episodes or faint while standing. °· You have severe abdominal pain. °· You feel sick to your stomach (nauseous) or throw up (vomit) and this lasts for more than 1 day. °Document Released: 02/18/2005 Document Revised: 12/09/2012 Document Reviewed: 09/30/2012 °Exit[employer]® Patient Information ©2015 [employer]. This information is not intended to replace advice given to you by your health care provider. Make sure you discuss any questions you have with your health care provider. ° °

## 2014-06-16 NOTE — Progress Notes (Signed)
Patient ID: Darlene MarkerMary Armato, female   DOB: 08/18/1970, 44 y.o.   MRN: [id number]  Pt had lap chole this am. IOC reviewed and it is negative.  GI signing off. Please call if needed.

## 2014-06-17 ENCOUNTER — Encounter (HOSPITAL_COMMUNITY)
Admission: EM | Disposition: A | Discharge status: Home / Self Care | Payer: Self-pay | Source: Home / Self Care | Attending: Emergency Medicine

## 2014-06-17 DIAGNOSIS — K8010 Calculus of gallbladder with chronic cholecystitis without obstruction: Secondary | ICD-10-CM | POA: Diagnosis not present

## 2014-06-17 DIAGNOSIS — K8050 Calculus of bile duct without cholangitis or cholecystitis without obstruction: Secondary | ICD-10-CM | POA: Diagnosis not present

## 2014-06-17 DIAGNOSIS — E876 Hypokalemia: Secondary | ICD-10-CM | POA: Diagnosis not present

## 2014-06-17 LAB — CBC
HCT: 40 % (ref 36.0–46.0)
HEMOGLOBIN: 13 g/dL (ref 12.0–15.0)
MCH: 30.6 pg (ref 26.0–34.0)
MCHC: 32.5 g/dL (ref 30.0–36.0)
MCV: 94.1 fL (ref 78.0–100.0)
Platelets: 232 10*3/uL (ref 150–400)
RBC: 4.25 MIL/uL (ref 3.87–5.11)
RDW: 13.4 % (ref 11.5–15.5)
WBC: 11.4 10*3/uL — ABNORMAL HIGH (ref 4.0–10.5)

## 2014-06-17 LAB — COMPREHENSIVE METABOLIC PANEL
ALBUMIN: 3.3 g/dL — AB (ref 3.5–5.2)
ALK PHOS: 69 U/L (ref 39–117)
ALT: 220 U/L — ABNORMAL HIGH (ref 0–35)
AST: 52 U/L — ABNORMAL HIGH (ref 0–37)
Anion gap: 5 (ref 5–15)
BUN: 6 mg/dL (ref 6–23)
CO2: 27 mmol/L (ref 19–32)
CREATININE: 0.87 mg/dL (ref 0.50–1.10)
Calcium: 8.5 mg/dL (ref 8.4–10.5)
Chloride: 105 mmol/L (ref 96–112)
GFR calc non Af Amer: 79 mL/min — ABNORMAL LOW (ref >90)
Glucose, Bld: 133 mg/dL — ABNORMAL HIGH (ref 70–99)
POTASSIUM: 3.9 mmol/L (ref 3.5–5.1)
Sodium: 137 mmol/L (ref 135–145)
Total Bilirubin: 0.6 mg/dL (ref 0.3–1.2)
Total Protein: 5.8 g/dL — ABNORMAL LOW (ref 6.0–8.3)

## 2014-06-17 LAB — LIPASE, BLOOD: Lipase: 18 U/L (ref 11–59)

## 2014-06-17 SURGERY — ERCP, WITH INTERVENTION IF INDICATED
Anesthesia: General

## 2014-06-17 MED ORDER — HYDROCODONE-ACETAMINOPHEN 5-325 MG PO TABS: 1.0000 | ORAL_TABLET | ORAL | Status: AC | PRN

## 2014-06-17 NOTE — Progress Notes (Signed)
Pt iv removed. Pt VSS. Reviewed d/c summary and prescriptions handed to pt.  No further questions. Justin Mendaudle, Sienna Stonehocker H, RN

## 2014-06-17 NOTE — Discharge Summary (Signed)
Physician Discharge Summary  Florinda MarkerMary Acero ZOX:[id number] DOB: 05/04/1970 DOA: 06/14/2014  PCP: Almedia BallsKELLY,SAM, MD  Admit date: 06/14/2014 Discharge date: 06/17/2014  Recommendations for Outpatient Follow-up:  1. Pt will need to follow up with PCP in 2 weeks post discharge 2. Please obtain LFTs and CBC in 1-2 weeks Discharge Diagnoses:  Biliary Colic/cholelithiasis with chronic cholecystitis/Transaminasemia -appreciate GI and surgical input -Lap chole on 06/16/14--IOC-->patent biliary tree -LFTs and Bili trending down -pain control -->home with Norco -antiemetics -await pathology -diet advanced which pt tolerated Hypokalemia -repleted -check mag--1.8 Anxiety -resume home meds--Celexa  Discharge Condition: stable  Disposition: home Follow-up Information    Follow up with [facility] [place] On 07/05/2014.   Why:  Your appointment is for 2 PM, be at the office for check in at 1:30PM   Contact information:   [address] [place] [id number] [phone]      Diet:soft Wt Readings from Last 3 Encounters:  06/17/14 66.5 kg (146 lb 9.7 oz)    History of present illness:  44 year old female without any major chronic medical problems presented to the emergency department with a one-month history of colicky epigastric pain. She stated the episodes lasted 30-40 minutes associated with some nausea, but they would resolve spontaneously. On 06/13/2014, the patient states that the episode lasted for several hours and was more intense than her prior episodes. Because of the persistent pain, the patient presented to the emergency department. Workup through the emergency room with upper abdominal ultrasound showed a distended gallbladder multiple gallstones the largest 1 cm no wall thickening and CBD of 7 mm. because of symptoms of biliary colic, GI and general surgery were consulted. The patient underwent laparoscopic cholecystectomy on 06/16/2014. Intraoperative  cholangiogram was negative for any obstruction.  Consultants: GI--[facility]--[facility]  Discharge Exam: Filed Vitals:   06/17/14 0516  BP: 115/55  Pulse: 50  Temp: 97.8 F (36.6 C)  Resp: 18   Filed Vitals:   06/16/14 1147 06/16/14 1401 06/16/14 2152 06/17/14 0516  BP: 103/53 108/57 119/61 115/55  Pulse: 57 57 60 50  Temp: 98.4 F (36.9 C) 97.8 F (36.6 C) 98.2 F (36.8 C) 97.8 F (36.6 C)  TempSrc:  Oral Oral Oral  Resp: 16 20 18 18   Height:      Weight:    66.5 kg (146 lb 9.7 oz)  SpO2: 95% 99% 100% 100%   General: A&O x 3, NAD, pleasant, cooperative Cardiovascular: RRR, no rub, no gallop, no S3 Respiratory: CTAB, no wheeze, no rhonchi Abdomen:soft, mild incisional pain, nondistended, positive bowel sounds Extremities: No edema, No lymphangitis, no petechiae  Discharge Instructions  Discharge Instructions    Diet - low sodium heart healthy    Complete by:  As directed      Discharge instructions    Complete by:  As directed   [facility]  LAPAROSCOPIC SURGERY:  POST-OP INSTRUCTIONS  Always review your discharge instruction sheet given to you by the facility where your surgery was performed.  A prescription for pain medication may be given to you upon discharge.  Take your pain medication as prescribed.  If narcotic pain medicine is not needed, then you may take acetaminophen (Tylenol) or ibuprofen (Advil) as needed.  Take your usually prescribed medications unless otherwise directed.  If you need a refill on your pain medication, please contact your pharmacy.  They will contact our office to request authorization. Prescriptions will not be filled after 5 P.M. or on weekends.  You  should follow a light diet the first few days after arrival home, such as soup and crackers or toast.  Be sure to include plenty of fluids daily.  Most patients will experience some swelling and bruising in the area of the incisions.  Ice packs will help.  Swelling  and bruising can take several days to resolve.   It is common to experience some constipation after surgery.  Increasing fluid intake and taking a stool softener (such as Colace) will usually help or prevent this problem from occurring.  A mild laxative (Milk of Magnesia or Miralax) should be taken according to package instructions if there has been no bowel movement after 48 hours.  If you have steri-strips and a gauze dressing over your incision, you may remove the gauze bandage on the second day after surgery, and you may shower at that time.  Leave your steri-strips (small skin tapes) in place directly over the incision.  These strips should remain on the skin for 7-10 days and then be removed.  You may get them wet in the shower and pat them dry.  If you have Dermabond (topical glue) over your incision, you may shower at any time following your procedure.  Leave the glue in place for 10-14 days.  When it begins to flake off around the edges, you may remove it.  Any sutures or staples will be removed at the office during your follow-up visit.  ACTIVITIES:  You may resume regular (light) daily activities beginning the next day-such as daily self-care, walking, climbing stairs-gradually increasing activities as tolerated.  You may have sexual intercourse when it is comfortable.  Refrain from any heavy lifting or straining until approved by your doctor.  You may drive when you are no longer taking prescription pain medication, you can comfortably wear a seatbelt, and you can safely maneuver your car and apply brakes.  You should see your doctor in the office for a follow-up appointment approximately 2-3 weeks after your surgery.  Make sure that you call for this appointment within a day or two after you arrive home to insure a convenient appointment time.  WHEN TO CALL YOUR DOCTOR: Fever over 101.0 Inability to urinate Continued bleeding from incision Increased pain, redness, or drainage from  the incision Increasing abdominal pain  The clinic staff is available to answer your questions during regular business hours.  Please don't hesitate to call and ask to speak to one of the nurses for clinical concerns.  If you have a medical emergency, go to the nearest emergency room or call 911.  A surgeon from [facility] is always on call for the hospital.  Velora Heckler, MD, [facility] Office: [phone] Toll Free:  [phone] FAX [phone]  Website: [email or web address]     Increase activity slowly    Complete by:  As directed      Remove dressing in 24 hours    Complete by:  As directed             Medication List    TAKE these medications        ALPRAZolam 0.5 MG tablet  Commonly known as:  XANAX  Take 0.5 mg by mouth at bedtime as needed for anxiety or sleep.     citalopram 10 MG tablet  Commonly known as:  CELEXA  Take 10 mg by mouth every morning.     HYDROcodone-acetaminophen 5-325 MG per tablet  Commonly known as:  NORCO/VICODIN  Take  1-2 tablets by mouth every 4 (four) hours as needed for moderate pain.         The results of significant diagnostics from this hospitalization (including imaging, microbiology, ancillary and laboratory) are listed below for reference.    Significant Diagnostic Studies: Dg Cholangiogram Operative  06/16/2014   CLINICAL DATA:  Cholelithiasis  EXAM: INTRAOPERATIVE CHOLANGIOGRAM  TECHNIQUE: Cholangiographic images from the C-arm fluoroscopic device were submitted for interpretation post-operatively. Please see the procedural report for the amount of contrast and the fluoroscopy time utilized.  COMPARISON:  None.  FINDINGS: Contrast fills the biliary tree without filling defects in the common bile duct.  IMPRESSION: Patent biliary tree without evidence of common bile duct stones.   Electronically Signed   By: Jolaine Click M.D.   On: 06/16/2014 10:32   US Abdomen  Complete  06/14/2014   CLINICAL DATA:  Epigastric pain, nausea for 3 weeks. Worsening today.  EXAM: ULTRASOUND ABDOMEN COMPLETE  COMPARISON:  None.  FINDINGS: Gallbladder: Gallbladder appears distended. Multiple small mobile echogenic foci compatible with gallstones, the largest 1 cm. No wall thickening. Negative sonographic Murphy's.  Common bile duct: Diameter: Upper limits normal in caliber at 7 mm.  Liver: Suggestion of intrahepatic biliary ductal dilatation. No focal abnormality. Normal echotexture.  IVC: No abnormality visualized.  Pancreas: Visualized portion unremarkable.  Spleen: Size and appearance within normal limits.  Right Kidney: Length: 11.3 cm. Echogenicity within normal limits. No mass or hydronephrosis visualized.  Left Kidney: Length: 11.5 cm. Echogenicity within normal limits. No mass or hydronephrosis visualized.  Abdominal aorta: No aneurysm visualized.  Other findings: None.  IMPRESSION: Mild gallbladder distention with several small mobile gallstones. No wall thickening or sonographic Murphy's sign.  Borderline common bile duct diameter at 7 mm. Suggestion of mild intrahepatic biliary ductal dilatation. Recommend correlation with LFTs.   Electronically Signed   By: Charlett Nose M.D.   On: 06/14/2014 16:43     Microbiology: No results found for this or any previous visit (from the past 240 hour(s)).   Labs: Basic Metabolic Panel:  Recent Labs Lab 06/14/14 1440 06/15/14 0535 06/16/14 0510 06/17/14 0535  NA 138 139 135 137  K 3.3* 3.9 4.1 3.9  CL 102 109 107 105  CO2 26 25 24 27   GLUCOSE 98 102* 118* 133*  BUN 16 17 17 6   CREATININE 0.99 0.93 0.85 0.87  CALCIUM 9.2 8.2* 7.9* 8.5  MG  --   --  1.8  --    Liver Function Tests:  Recent Labs Lab 06/14/14 1440 06/15/14 0535 06/16/14 0510 06/17/14 0535  AST 265* 232* 65* 52*  ALT 447* 462* 269* 220*  ALKPHOS 67 93 81 69  BILITOT 2.4* 1.3* 0.6 0.6  PROT 6.9 5.8* 5.3* 5.8*  ALBUMIN 3.9 3.4* 3.1* 3.3*    Recent  Labs Lab 06/14/14 1440 06/15/14 0540 06/16/14 0510 06/17/14 0535  LIPASE 35 40 34 18   No results for input(s): AMMONIA in the last 168 hours. CBC:  Recent Labs Lab 06/14/14 1440 06/15/14 0535 06/16/14 0510 06/17/14 0535  WBC 7.9 5.3 5.6 11.4*  NEUTROABS 5.6  --   --   --   HGB 13.7 12.5 11.9* 13.0  HCT 40.7 38.6 36.6 40.0  MCV 90.8 93.2 93.8 94.1  PLT 238 231 196 232   Cardiac Enzymes: No results for input(s): CKTOTAL, CKMB, CKMBINDEX, TROPONINI in the last 168 hours. BNP: Invalid input(s): POCBNP CBG: No results for input(s): GLUCAP in the last 168 hours.  Time  coordinating discharge:  Greater than 30 minutes  Signed:  Elliannah Wayment, DO [facility] Pager: [phone] 06/17/2014, 10:03 AM

## 2014-06-17 NOTE — Discharge Summary (Signed)
Physician Discharge Summary [facility]  Patient ID: Darlene MarkerMary Stevens MRN: [id number] DOB/AGE: 46/05/1970 46 y.o.  Admit date: 06/14/2014 Discharge date: 06/17/2014  Admission Diagnoses:  Cholelithiasis, choledocholithiasis  Discharge Diagnoses:  Principal Problem:   Biliary colic Active Problems:   Gallstones   LFT elevation   Hypokalemia   Mood disorder   Cholelithiasis with chronic cholecystitis   Discharged Condition: good  Hospital Course: patient admitted with abdominal pain and elevated LFT's with suspected cholelithiasis and CBD stones.  Seen in consultation by general surgery and gastroenterology.  Prepared for OR.  Lap chole with IOC on 06/16/2014 with negative IOC.  Post op course uncomplicated.  Tolerating diet.  Ambulatory.  Pain controlled.  Prepared for discharge on POD#1.  Consults: general surgery, GI  Treatments: surgery: lap chole with IOC  Discharge Exam: Blood pressure 115/55, pulse 50, temperature 97.8 F (36.6 C), temperature source Oral, resp. rate 18, height 5\' 8"  (1.727 m), weight 66.5 kg (146 lb 9.7 oz), SpO2 100 %. HEENT - clear Neck - soft Chest - clear bilaterally Cor - RRR Abd - soft, dressings dry and intact  Disposition: Home  Discharge Instructions    Diet - low sodium heart healthy    Complete by:  As directed      Discharge instructions    Complete by:  As directed   [facility]  LAPAROSCOPIC SURGERY:  POST-OP INSTRUCTIONS  Always review your discharge instruction sheet given to you by the facility where your surgery was performed.  A prescription for pain medication may be given to you upon discharge.  Take your pain medication as prescribed.  If narcotic pain medicine is not needed, then you may take acetaminophen (Tylenol) or ibuprofen (Advil) as needed.  Take your usually prescribed medications unless otherwise directed.  If you need a refill on your pain medication, please contact your  pharmacy.  They will contact our office to request authorization. Prescriptions will not be filled after 5 P.M. or on weekends.  You should follow a light diet the first few days after arrival home, such as soup and crackers or toast.  Be sure to include plenty of fluids daily.  Most patients will experience some swelling and bruising in the area of the incisions.  Ice packs will help.  Swelling and bruising can take several days to resolve.   It is common to experience some constipation after surgery.  Increasing fluid intake and taking a stool softener (such as Colace) will usually help or prevent this problem from occurring.  A mild laxative (Milk of Magnesia or Miralax) should be taken according to package instructions if there has been no bowel movement after 48 hours.  If you have steri-strips and a gauze dressing over your incision, you may remove the gauze bandage on the second day after surgery, and you may shower at that time.  Leave your steri-strips (small skin tapes) in place directly over the incision.  These strips should remain on the skin for 7-10 days and then be removed.  You may get them wet in the shower and pat them dry.  If you have Dermabond (topical glue) over your incision, you may shower at any time following your procedure.  Leave the glue in place for 10-14 days.  When it begins to flake off around the edges, you may remove it.  Any sutures or staples will be removed at the office during your follow-up visit.  ACTIVITIES:  You may resume regular (light)  daily activities beginning the next day-such as daily self-care, walking, climbing stairs-gradually increasing activities as tolerated.  You may have sexual intercourse when it is comfortable.  Refrain from any heavy lifting or straining until approved by your doctor.  You may drive when you are no longer taking prescription pain medication, you can comfortably wear a seatbelt, and you can safely maneuver your car and  apply brakes.  You should see your doctor in the office for a follow-up appointment approximately 2-3 weeks after your surgery.  Make sure that you call for this appointment within a day or two after you arrive home to insure a convenient appointment time.  WHEN TO CALL YOUR DOCTOR: Fever over 101.0 Inability to urinate Continued bleeding from incision Increased pain, redness, or drainage from the incision Increasing abdominal pain  The clinic staff is available to answer your questions during regular business hours.  Please don't hesitate to call and ask to speak to one of the nurses for clinical concerns.  If you have a medical emergency, go to the nearest emergency room or call 911.  A surgeon from [facility] is always on call for the hospital.  Velora Heckler, MD, [facility] Office: [phone] Toll Free:  [phone] FAX [phone]  Website: [email or web address]     Increase activity slowly    Complete by:  As directed      Remove dressing in 24 hours    Complete by:  As directed             Medication List    TAKE these medications        ALPRAZolam 0.5 MG tablet  Commonly known as:  XANAX  Take 0.5 mg by mouth at bedtime as needed for anxiety or sleep.     citalopram 10 MG tablet  Commonly known as:  CELEXA  Take 10 mg by mouth every morning.     HYDROcodone-acetaminophen 5-325 MG per tablet  Commonly known as:  NORCO/VICODIN  Take 1-2 tablets by mouth every 4 (four) hours as needed for moderate pain.           Follow-up Information    Follow up with [facility] [place] On 07/05/2014.   Why:  Your appointment is for 2 PM, be at the office for check in at 1:30PM   Contact information:   [address] [place] [address] [phone]      Velora Heckler, MD, [facility] Office: [phone]   Signed: Velora Heckler 06/17/2014, 8:20  AM

## 2014-06-20 ENCOUNTER — Encounter (HOSPITAL_COMMUNITY): Payer: Self-pay | Admitting: Surgery

## 2016-04-09 IMAGING — US US ABDOMEN COMPLETE
1 series · 14 of 25 positions shown · non-contrast
Comparison: None.

CLINICAL DATA: Epigastric pain, nausea for 3 weeks. Worsening
today.

EXAM:
ULTRASOUND ABDOMEN COMPLETE

[Series 1: us abdomen complete · 0.14mm/px · 14 of 94 slices shown]
[im 1/94]
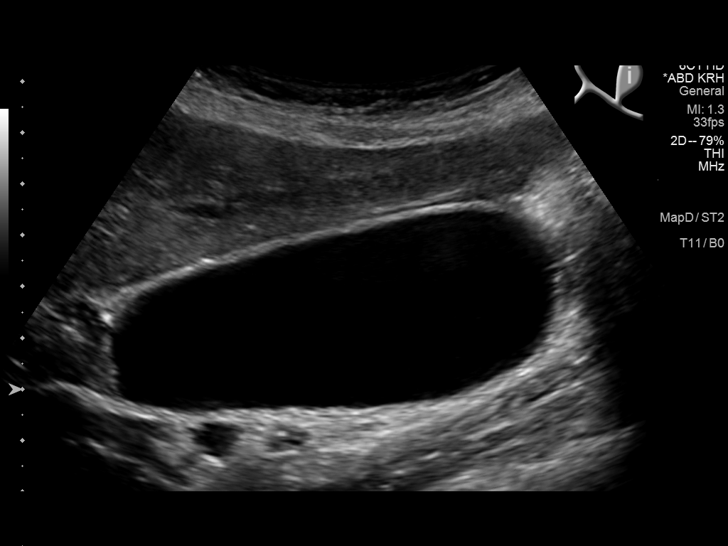
[im 8/94]
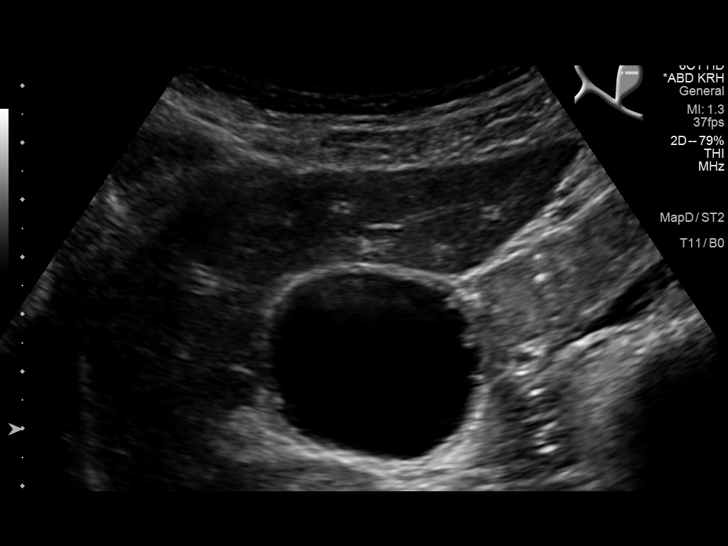
[im 16/94]
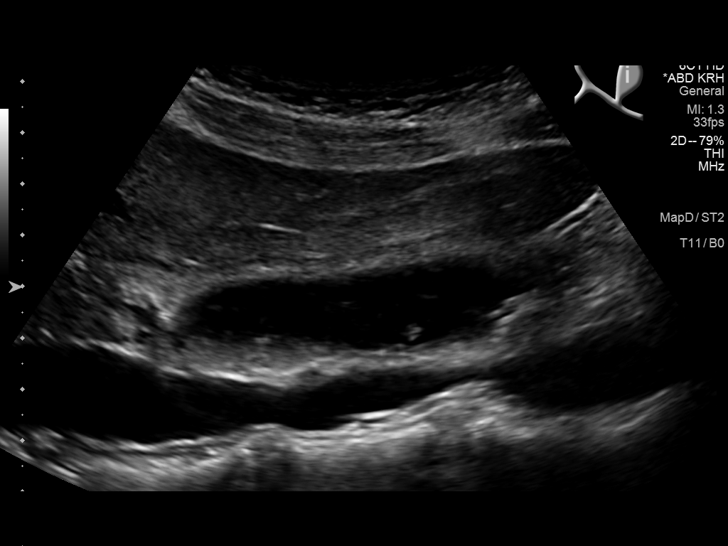
[im 24/94]
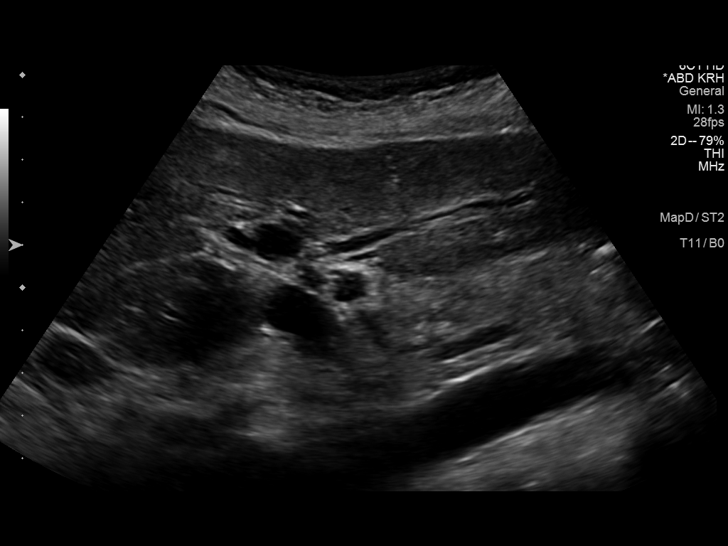
[im 32/94]
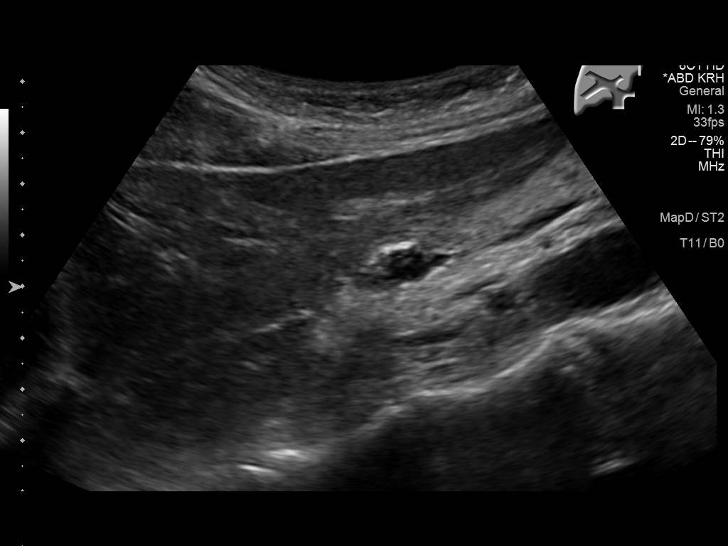
[im 35/94]
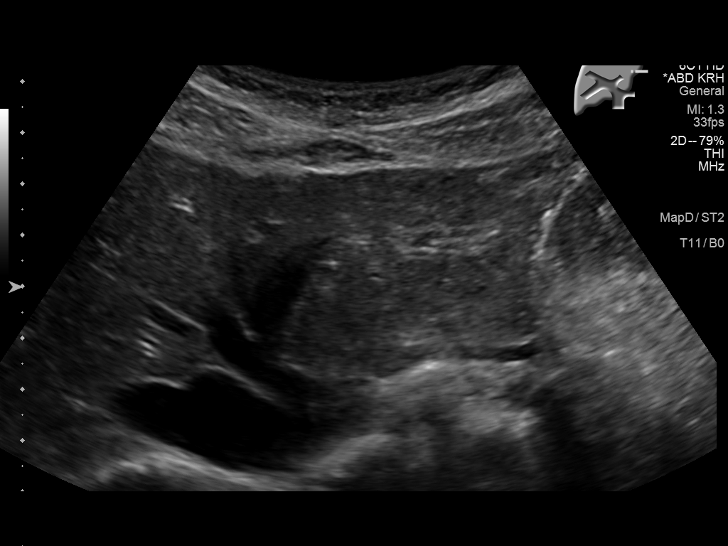
[im 43/94]
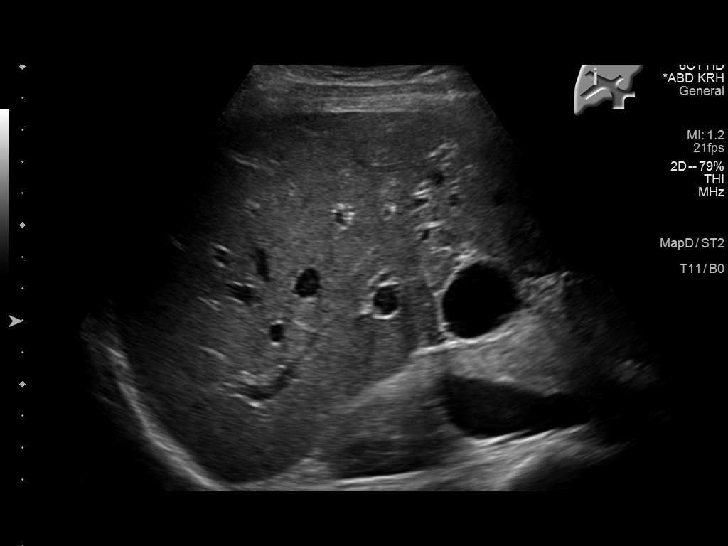
[im 51/94]
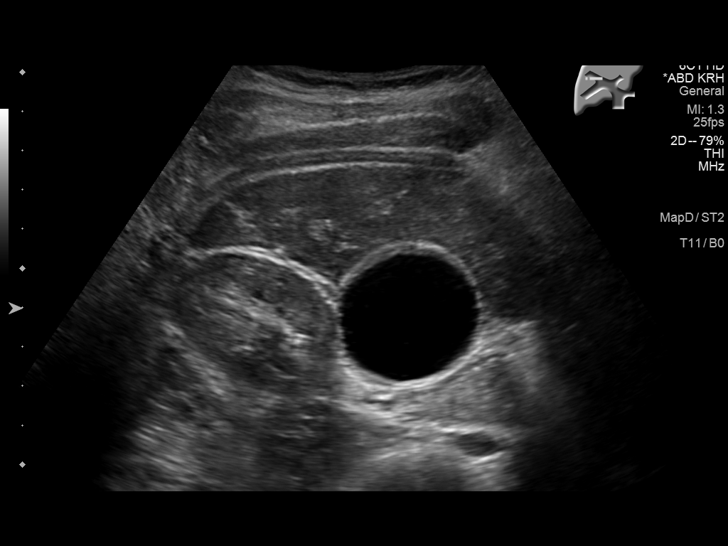
[im 59/94]
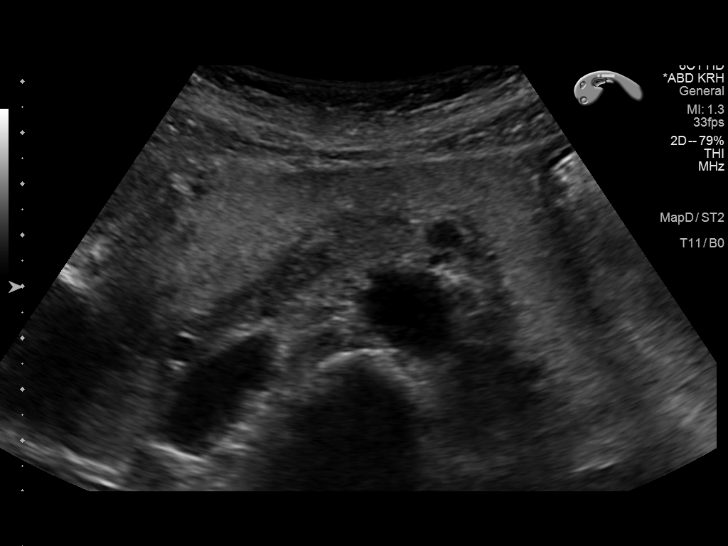
[im 63/94]
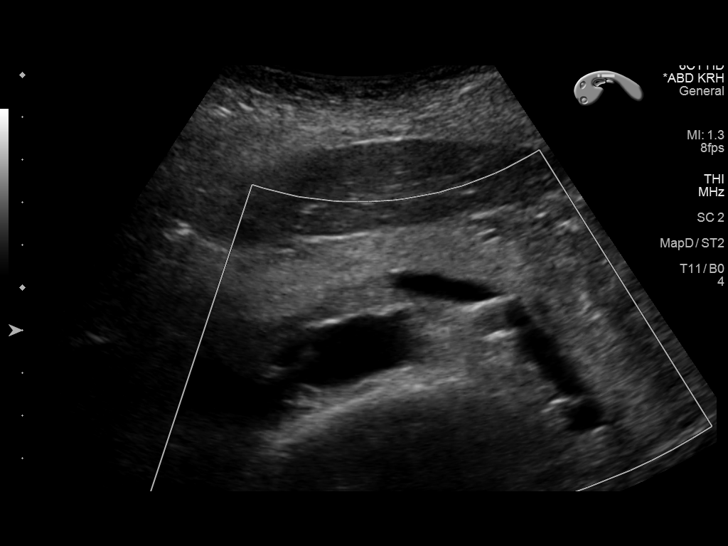
[im 70/94]
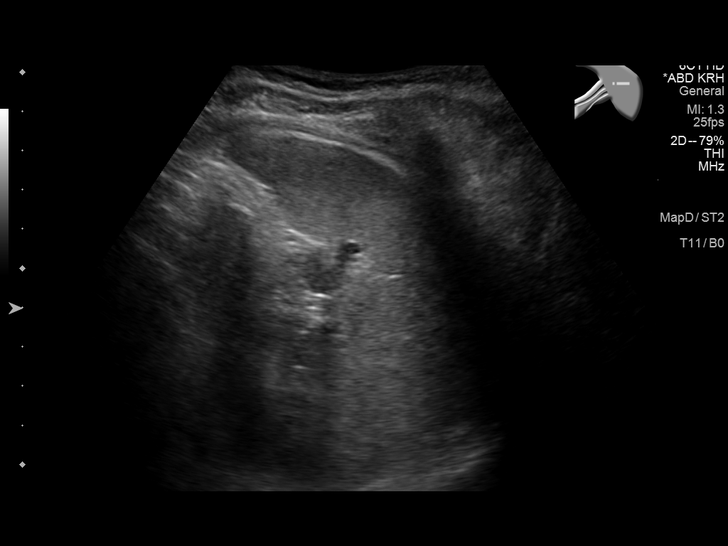
[im 78/94]
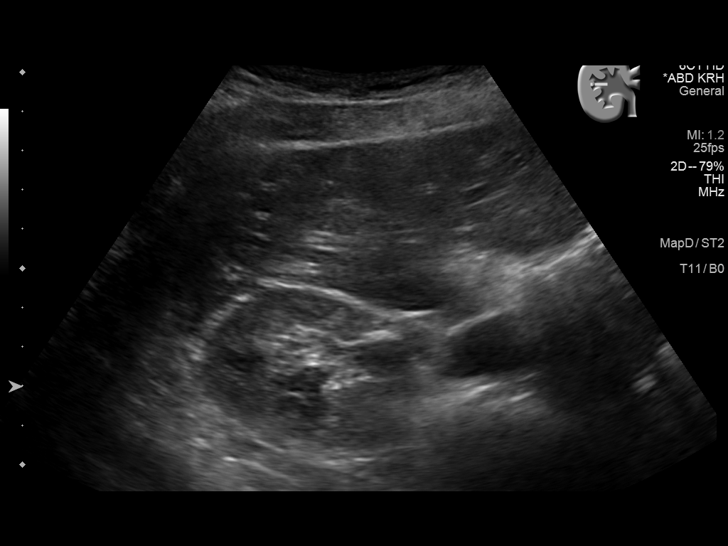
[im 86/94]
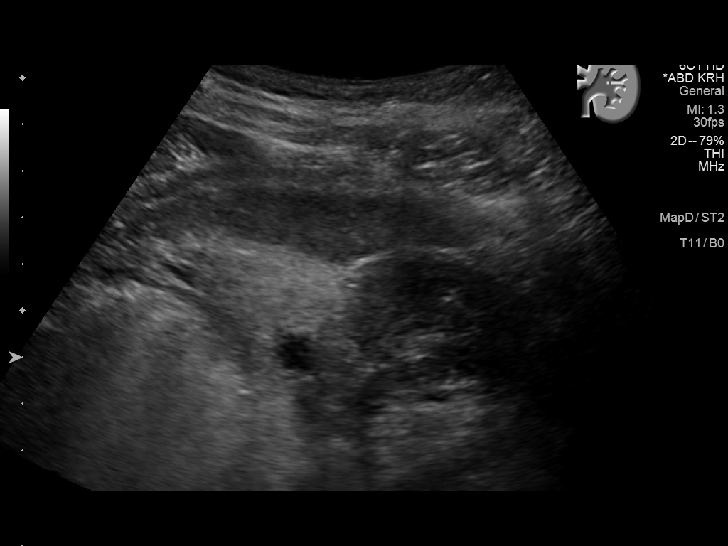
[im 94/94]
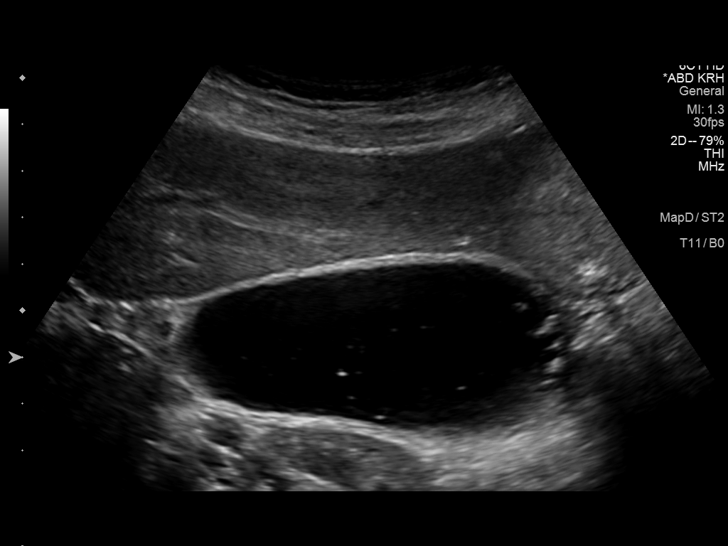

[14 of 25 positions shown; findings below may reference images not displayed]

FINDINGS: Gallbladder: Gallbladder appears distended. Multiple small mobile
echogenic foci compatible with gallstones, the largest 1 cm. No wall
thickening. Negative sonographic [place].

Common bile duct: Diameter: Upper limits normal in caliber at 7 mm.

Liver: Suggestion of intrahepatic biliary ductal dilatation. No
focal abnormality. Normal echotexture.

IVC: No abnormality visualized.

Pancreas: Visualized portion unremarkable.

Spleen: Size and appearance within normal limits.

Right Kidney: Length: 11.3 cm. Echogenicity within normal limits. No
mass or hydronephrosis visualized.

Left Kidney: Length: 11.5 cm. Echogenicity within normal limits. No
mass or hydronephrosis visualized.

Abdominal aorta: No aneurysm visualized.

Other findings: None.
IMPRESSION: Mild gallbladder distention with several small mobile gallstones. No
wall thickening or sonographic Murphy's sign.

Borderline common bile duct diameter at 7 mm. Suggestion of mild
intrahepatic biliary ductal dilatation. Recommend correlation with
LFTs.

## 2016-04-11 IMAGING — RF DG CHOLANGIOGRAM OPERATIVE
1 series · 12 of 12 positions shown · non-contrast
Comparison: None.

CLINICAL DATA: Cholelithiasis

EXAM:
INTRAOPERATIVE CHOLANGIOGRAM
TECHNIQUE: Cholangiographic images from the C-arm fluoroscopic device were
submitted for interpretation post-operatively. Please see the
procedural report for the amount of contrast and the fluoroscopy
time utilized.

[Series 1: run · 3 acquisitions, 12 frames shown]
[im 1/3]
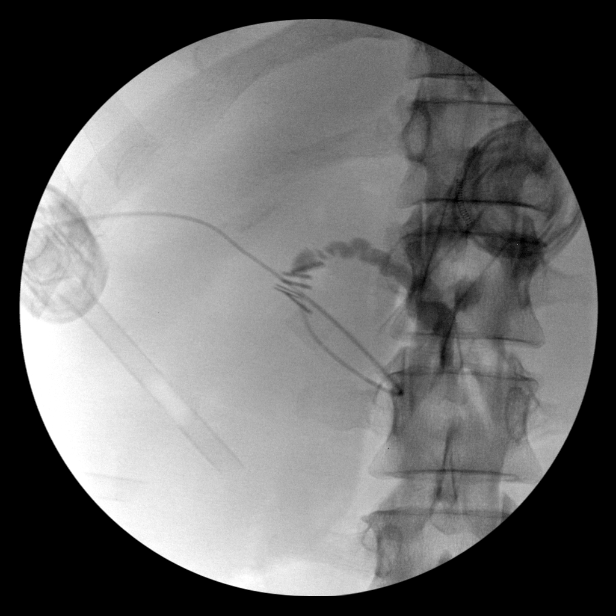
[im 1/3]
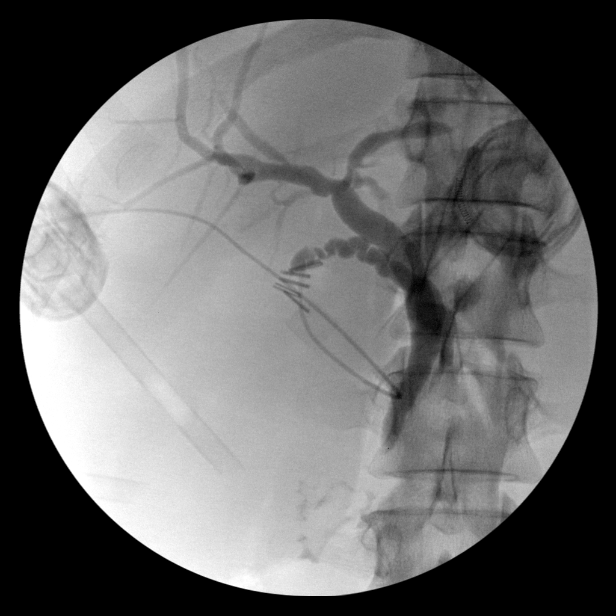
[im 1/3]
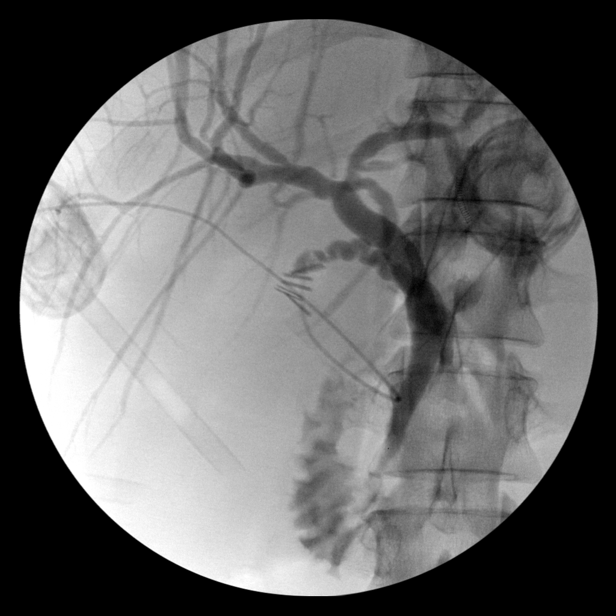
[im 1/3]
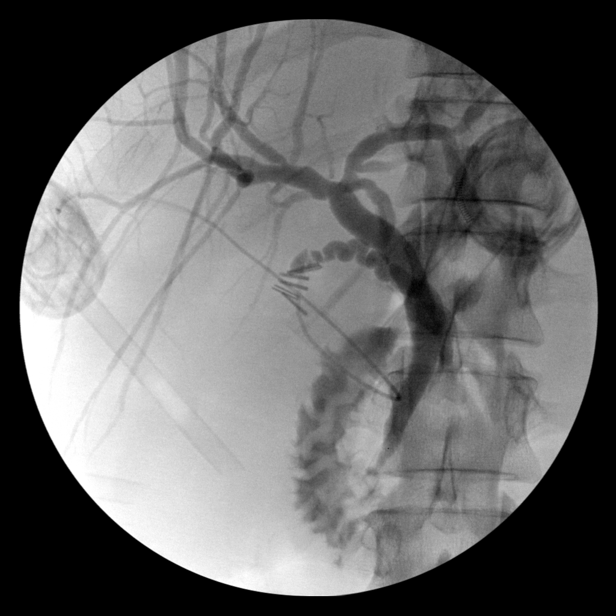
[im 2/3]
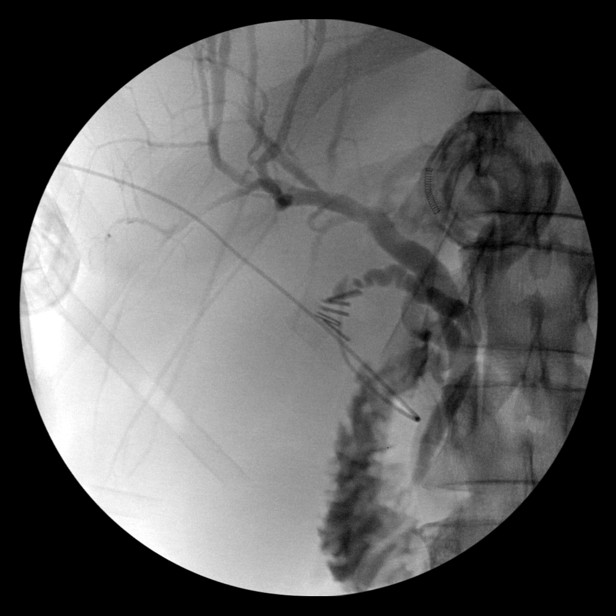
[im 2/3]
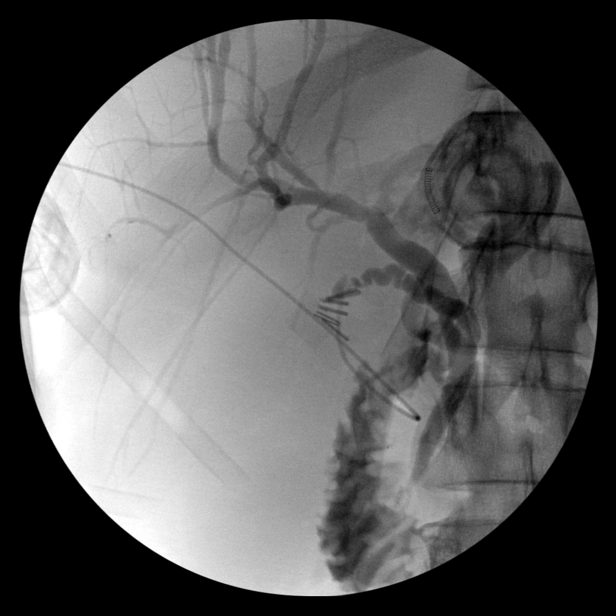
[im 2/3]
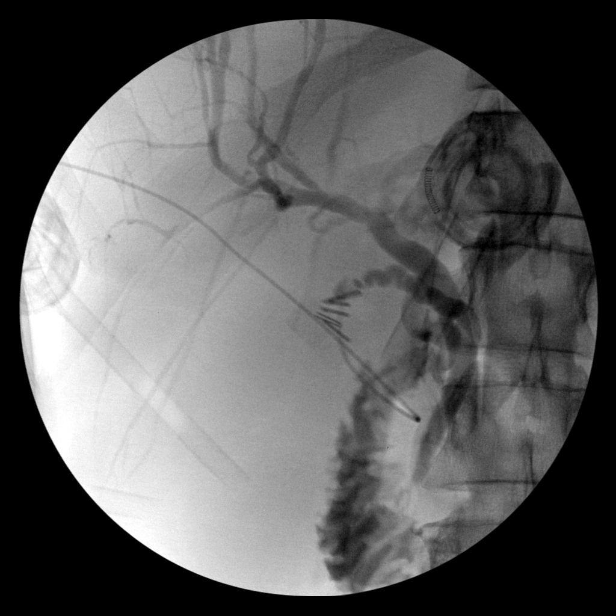
[im 2/3]
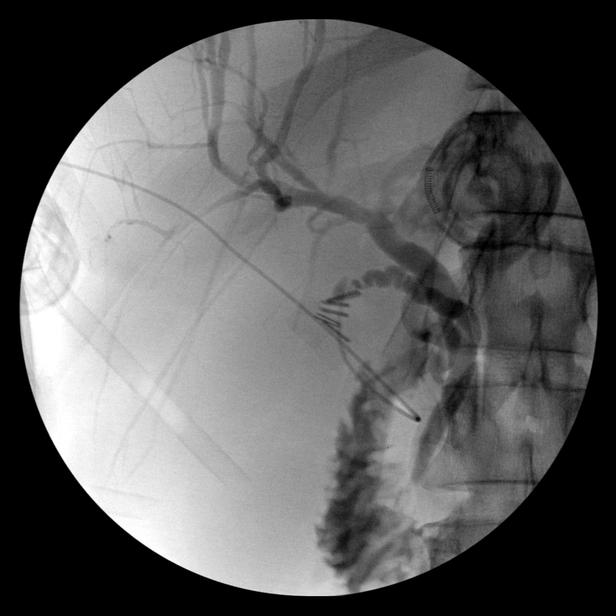
[im 3/3]
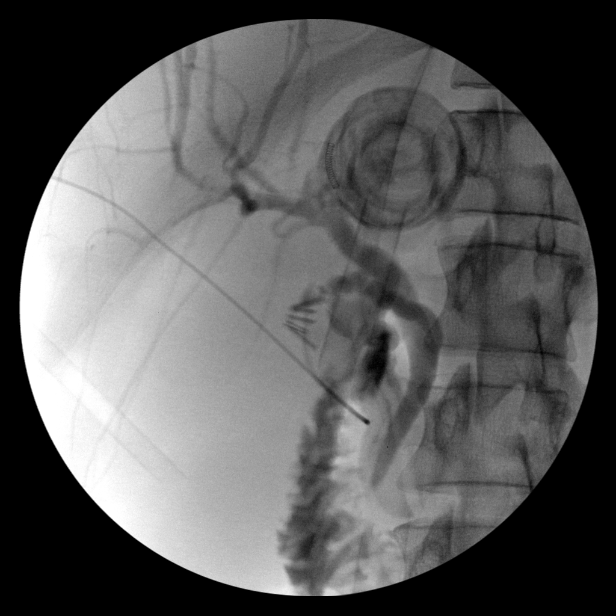
[im 3/3]
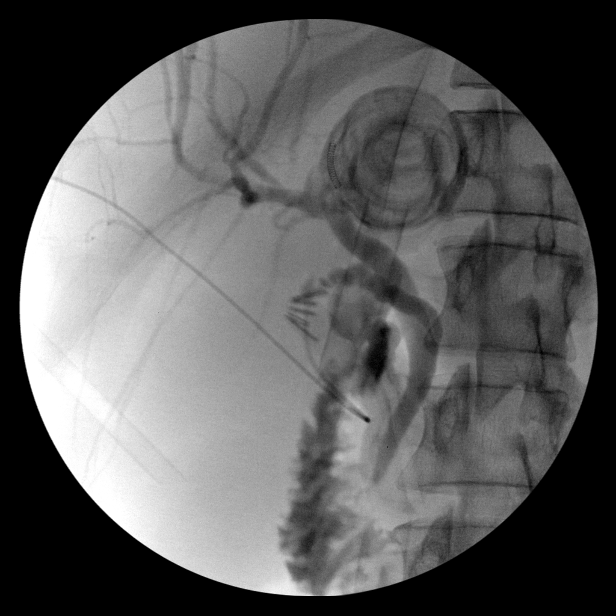
[im 3/3]
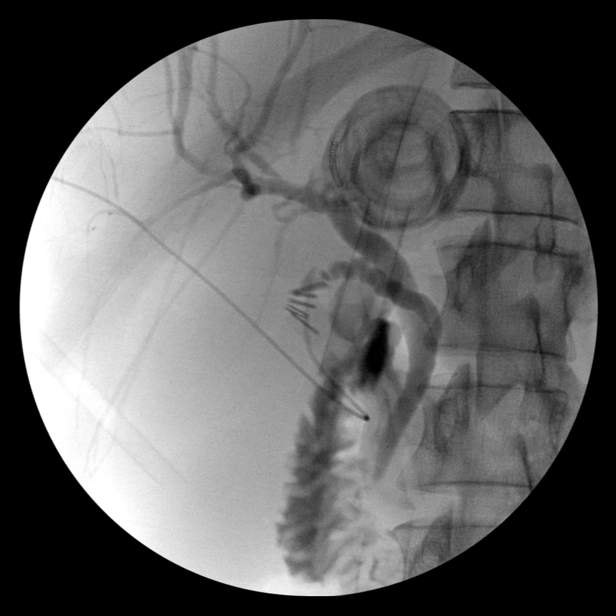
[im 3/3]
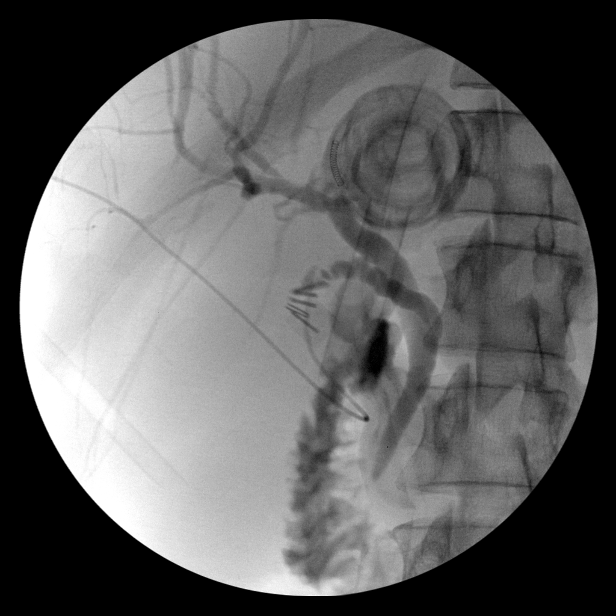

[12 of 12 positions shown; findings below may reference images not displayed]

FINDINGS: Contrast fills the biliary tree without filling defects in the
common bile duct.
IMPRESSION: Patent biliary tree without evidence of common bile duct stones.

## 2018-09-25 ENCOUNTER — Encounter: Payer: Self-pay | Admitting: Gastroenterology

## 2019-10-07 ENCOUNTER — Other Ambulatory Visit: Payer: Self-pay | Admitting: Physician Assistant

## 2019-10-07 DIAGNOSIS — U071 COVID-19: Secondary | ICD-10-CM

## 2019-10-07 DIAGNOSIS — Z6826 Body mass index (BMI) 26.0-26.9, adult: Secondary | ICD-10-CM

## 2019-10-07 NOTE — Progress Notes (Signed)
I connected by phone with Darlene Stevens on 10/07/2019 at 3:40 PM to discuss the potential use of a new treatment for mild to moderate COVID-19 viral infection in non-hospitalized patients.  This patient is a 49 y.o. female that meets the FDA criteria for Emergency Use Authorization of COVID monoclonal antibody casirivimab/imdevimab.  Has a (+) direct SARS-CoV-2 viral test result  Has mild or moderate COVID-19   Is NOT hospitalized due to COVID-19  Is within 10 days of symptom onset  Has at least one of the high risk factor(s) for progression to severe COVID-19 and/or hospitalization as defined in EUA.  Specific high risk criteria : BMI > 25   I have spoken and communicated the following to the patient or parent/caregiver regarding COVID monoclonal antibody treatment:  1. FDA has authorized the emergency use for the treatment of mild to moderate COVID-19 in adults and pediatric patients with positive results of direct SARS-CoV-2 viral testing who are 34 years of age and older weighing at least 40 kg, and who are at high risk for progressing to severe COVID-19 and/or hospitalization.  2. The significant known and potential risks and benefits of COVID monoclonal antibody, and the extent to which such potential risks and benefits are unknown.  3. Information on available alternative treatments and the risks and benefits of those alternatives, including clinical trials.  4. Patients treated with COVID monoclonal antibody should continue to self-isolate and use infection control measures (e.g., wear mask, isolate, social distance, avoid sharing personal items, clean and disinfect "high touch" surfaces, and frequent handwashing) according to CDC guidelines.   5. The patient or parent/caregiver has the option to accept or refuse COVID monoclonal antibody treatment.  After reviewing this information with the patient, The patient agreed to proceed with receiving casirivimab\imdevimab infusion and  will be provided a copy of the Fact sheet prior to receiving the infusion.  Sx onset 3/2. Set up for infusion on 8/7 @ 10:30am. Directions given to [facility]. Pt is aware that insurance will be charged an infusion fee.   Of note, pt is fully vaccinated with J&J vaccine.   [name] 10/07/2019 3:40 PM

## 2019-10-08 MED ORDER — SODIUM CHLORIDE 0.9 % IV SOLN
1200.0000 mg | Freq: Once | INTRAVENOUS | Status: AC
  Administered 2019-10-09: 1200 mg via INTRAVENOUS
  Filled 2019-10-08: qty 1200

## 2019-10-09 ENCOUNTER — Ambulatory Visit (HOSPITAL_COMMUNITY)
Admission: RE | Admit: 2019-10-09 | Discharge: 2019-10-09 | Disposition: A | Discharge status: Home / Self Care | Payer: 59 | Source: Ambulatory Visit | Attending: Pulmonary Disease | Admitting: Pulmonary Disease

## 2019-10-09 DIAGNOSIS — Z6826 Body mass index (BMI) 26.0-26.9, adult: Secondary | ICD-10-CM | POA: Diagnosis not present

## 2019-10-09 DIAGNOSIS — U071 COVID-19: Secondary | ICD-10-CM

## 2019-10-09 MED ORDER — METHYLPREDNISOLONE SODIUM SUCC 125 MG IJ SOLR: 125.0000 mg | Freq: Once | INTRAMUSCULAR | Status: DC | PRN

## 2019-10-09 MED ORDER — SODIUM CHLORIDE 0.9 % IV SOLN: INTRAVENOUS | Status: DC | PRN

## 2019-10-09 MED ORDER — EPINEPHRINE 0.3 MG/0.3ML IJ SOAJ: 0.3000 mg | Freq: Once | INTRAMUSCULAR | Status: DC | PRN

## 2019-10-09 MED ORDER — FAMOTIDINE IN NACL 20-0.9 MG/50ML-% IV SOLN: 20.0000 mg | Freq: Once | INTRAVENOUS | Status: DC | PRN

## 2019-10-09 MED ORDER — ALBUTEROL SULFATE HFA 108 (90 BASE) MCG/ACT IN AERS: 2.0000 | INHALATION_SPRAY | Freq: Once | RESPIRATORY_TRACT | Status: DC | PRN

## 2019-10-09 MED ORDER — DIPHENHYDRAMINE HCL 50 MG/ML IJ SOLN: 50.0000 mg | Freq: Once | INTRAMUSCULAR | Status: DC | PRN

## 2019-10-09 NOTE — Discharge Instructions (Signed)

## 2019-10-09 NOTE — Progress Notes (Signed)
  Diagnosis: COVID-19  Physician: Dr. Delford Field  Procedure: Cov[facility] Infusion Clinic Med: casirivimab\imdevimab infusion - Provided patient with casirivimab\imdevimab fact sheet for patients, parents and caregivers prior to infusion.  Complications: No immediate complications noted.  Discharge: Discharged home   Angelia Mould 10/09/2019
